# Patient Record
Sex: Male | Born: 1960 | Race: White | Hispanic: No | Marital: Married | Smoking: Current every day smoker
Health system: Southern US, Community
[De-identification: ages and names within clinical notes are randomized; demographics above are authoritative.]

## PROBLEM LIST (undated history)

## (undated) DIAGNOSIS — Z860100 Personal history of colon polyps, unspecified: Secondary | ICD-10-CM

## (undated) HISTORY — PX: COLONOSCOPY: SHX174

---

## 2019-10-22 ENCOUNTER — Ambulatory Visit: Payer: Self-pay | Attending: Family

## 2019-10-22 DIAGNOSIS — Z23 Encounter for immunization: Secondary | ICD-10-CM

## 2019-10-22 NOTE — Progress Notes (Signed)
   Covid-19 Vaccination Clinic  Name:  Shawn Reese    MRN: [id number] DOB: May 20, 1961  10/22/2019  Mr. [name] was observed post Covid-19 immunization for 15 minutes without incident. He was provided with Vaccine Information Sheet and instruction to access the V-Safe system.   Mr. [name] was instructed to call 911 with any severe reactions post vaccine: Marland Kitchen Difficulty breathing  . Swelling of face and throat  . A fast heartbeat  . A bad rash all over body  . Dizziness and weakness   Immunizations Administered    Name Date Dose VIS Date Route   Moderna COVID-19 Vaccine 10/22/2019  9:53 AM 0.5 mL 07/07/2019 Intramuscular   Manufacturer: Moderna   Lot: [id number]   NDC: 83437-357-89

## 2019-11-24 ENCOUNTER — Ambulatory Visit: Payer: Self-pay | Attending: Family

## 2019-11-24 DIAGNOSIS — Z23 Encounter for immunization: Secondary | ICD-10-CM

## 2019-11-24 NOTE — Progress Notes (Signed)
   Covid-19 Vaccination Clinic  Name:  Shawn Reese    MRN: [id number] DOB: [birth year]  11/24/2019  Shawn Reese was observed post Covid-19 immunization for 15 minutes without incident. He was provided with Vaccine Information Sheet and instruction to access the V-Safe system.   Shawn Reese was instructed to call 911 with any severe reactions post vaccine: Marland Kitchen Difficulty breathing  . Swelling of face and throat  . A fast heartbeat  . A bad rash all over body  . Dizziness and weakness   Immunizations Administered    Name Date Dose VIS Date Route   Moderna COVID-19 Vaccine 11/24/2019  9:54 AM 0.5 mL 07/2019 Intramuscular   Manufacturer: Moderna   Lot: [id number]   NDC: 03009-233-00

## 2019-12-18 ENCOUNTER — Encounter (HOSPITAL_BASED_OUTPATIENT_CLINIC_OR_DEPARTMENT_OTHER): Payer: Self-pay | Admitting: *Deleted

## 2019-12-18 ENCOUNTER — Other Ambulatory Visit: Payer: Self-pay

## 2019-12-18 ENCOUNTER — Inpatient Hospital Stay (HOSPITAL_BASED_OUTPATIENT_CLINIC_OR_DEPARTMENT_OTHER)
Admission: EM | Admit: 2019-12-18 | Discharge: 2019-12-24 | DRG: 330 | Disposition: A | Discharge status: Home / Self Care | Attending: Internal Medicine | Admitting: Internal Medicine

## 2019-12-18 ENCOUNTER — Emergency Department (HOSPITAL_BASED_OUTPATIENT_CLINIC_OR_DEPARTMENT_OTHER)

## 2019-12-18 DIAGNOSIS — K529 Noninfective gastroenteritis and colitis, unspecified: Secondary | ICD-10-CM | POA: Diagnosis not present

## 2019-12-18 DIAGNOSIS — Z791 Long term (current) use of non-steroidal anti-inflammatories (NSAID): Secondary | ICD-10-CM

## 2019-12-18 DIAGNOSIS — R197 Diarrhea, unspecified: Secondary | ICD-10-CM | POA: Diagnosis present

## 2019-12-18 DIAGNOSIS — A09 Infectious gastroenteritis and colitis, unspecified: Secondary | ICD-10-CM | POA: Diagnosis present

## 2019-12-18 DIAGNOSIS — Z20822 Contact with and (suspected) exposure to covid-19: Secondary | ICD-10-CM | POA: Diagnosis present

## 2019-12-18 DIAGNOSIS — Z86010 Personal history of colonic polyps: Secondary | ICD-10-CM

## 2019-12-18 DIAGNOSIS — K640 First degree hemorrhoids: Secondary | ICD-10-CM | POA: Diagnosis present

## 2019-12-18 DIAGNOSIS — K633 Ulcer of intestine: Secondary | ICD-10-CM | POA: Diagnosis present

## 2019-12-18 DIAGNOSIS — F17200 Nicotine dependence, unspecified, uncomplicated: Secondary | ICD-10-CM | POA: Diagnosis present

## 2019-12-18 DIAGNOSIS — K5732 Diverticulitis of large intestine without perforation or abscess without bleeding: Secondary | ICD-10-CM | POA: Diagnosis present

## 2019-12-18 DIAGNOSIS — E876 Hypokalemia: Secondary | ICD-10-CM | POA: Diagnosis present

## 2019-12-18 DIAGNOSIS — D72829 Elevated white blood cell count, unspecified: Secondary | ICD-10-CM

## 2019-12-18 DIAGNOSIS — K56609 Unspecified intestinal obstruction, unspecified as to partial versus complete obstruction: Secondary | ICD-10-CM | POA: Diagnosis present

## 2019-12-18 DIAGNOSIS — K5669 Other partial intestinal obstruction: Secondary | ICD-10-CM | POA: Diagnosis present

## 2019-12-18 DIAGNOSIS — E871 Hypo-osmolality and hyponatremia: Secondary | ICD-10-CM | POA: Diagnosis present

## 2019-12-18 DIAGNOSIS — R109 Unspecified abdominal pain: Secondary | ICD-10-CM

## 2019-12-18 LAB — CBC
HCT: 45.7 % (ref 39.0–52.0)
Hemoglobin: 15.8 g/dL (ref 13.0–17.0)
MCH: 30.9 pg (ref 26.0–34.0)
MCHC: 34.6 g/dL (ref 30.0–36.0)
MCV: 89.4 fL (ref 80.0–100.0)
Platelets: 346 10*3/uL (ref 150–400)
RBC: 5.11 MIL/uL (ref 4.22–5.81)
RDW: 13.2 % (ref 11.5–15.5)
WBC: 22.3 10*3/uL — ABNORMAL HIGH (ref 4.0–10.5)
nRBC: 0 % (ref 0.0–0.2)

## 2019-12-18 LAB — DIFFERENTIAL
Abs Immature Granulocytes: 0.57 10*3/uL — ABNORMAL HIGH (ref 0.00–0.07)
Basophils Absolute: 0 10*3/uL (ref 0.0–0.1)
Basophils Relative: 0 %
Eosinophils Absolute: 0 10*3/uL (ref 0.0–0.5)
Eosinophils Relative: 0 %
Immature Granulocytes: 3 %
Lymphocytes Relative: 7 %
Lymphs Abs: 1.6 10*3/uL (ref 0.7–4.0)
Monocytes Absolute: 3.1 10*3/uL — ABNORMAL HIGH (ref 0.1–1.0)
Monocytes Relative: 14 %
Neutro Abs: 17.1 10*3/uL — ABNORMAL HIGH (ref 1.7–7.7)
Neutrophils Relative %: 76 %
Smear Review: NORMAL

## 2019-12-18 LAB — SARS CORONAVIRUS 2 BY RT PCR (HOSPITAL ORDER, PERFORMED IN ~~LOC~~ HOSPITAL LAB): SARS Coronavirus 2: NEGATIVE

## 2019-12-18 LAB — URINALYSIS, ROUTINE W REFLEX MICROSCOPIC
Glucose, UA: NEGATIVE mg/dL
Hgb urine dipstick: NEGATIVE
Ketones, ur: NEGATIVE mg/dL
Leukocytes,Ua: NEGATIVE
Nitrite: NEGATIVE
Protein, ur: 30 mg/dL — AB
Specific Gravity, Urine: 1.025 (ref 1.005–1.030)
pH: 6 (ref 5.0–8.0)

## 2019-12-18 LAB — COMPREHENSIVE METABOLIC PANEL
ALT: 21 U/L (ref 0–44)
AST: 16 U/L (ref 15–41)
Albumin: 3.3 g/dL — ABNORMAL LOW (ref 3.5–5.0)
Alkaline Phosphatase: 86 U/L (ref 38–126)
Anion gap: 12 (ref 5–15)
BUN: 18 mg/dL (ref 6–20)
CO2: 26 mmol/L (ref 22–32)
Calcium: 8.4 mg/dL — ABNORMAL LOW (ref 8.9–10.3)
Chloride: 92 mmol/L — ABNORMAL LOW (ref 98–111)
Creatinine, Ser: 0.7 mg/dL (ref 0.61–1.24)
GFR calc Af Amer: >60 mL/min (ref >60)
GFR calc non Af Amer: >60 mL/min (ref >60)
Glucose, Bld: 128 mg/dL — ABNORMAL HIGH (ref 70–99)
Potassium: 3.3 mmol/L — ABNORMAL LOW (ref 3.5–5.1)
Sodium: 130 mmol/L — ABNORMAL LOW (ref 135–145)
Total Bilirubin: 1 mg/dL (ref 0.3–1.2)
Total Protein: 6.9 g/dL (ref 6.5–8.1)

## 2019-12-18 LAB — URINALYSIS, MICROSCOPIC (REFLEX)

## 2019-12-18 LAB — LACTIC ACID, PLASMA: Lactic Acid, Venous: 0.9 mmol/L (ref 0.5–1.9)

## 2019-12-18 LAB — LIPASE, BLOOD: Lipase: 20 U/L (ref 11–51)

## 2019-12-18 MED ORDER — SODIUM CHLORIDE 0.9 % IV BOLUS (SEPSIS)
1000.0000 mL | Freq: Once | INTRAVENOUS | Status: AC
  Administered 2019-12-18: 1000 mL via INTRAVENOUS

## 2019-12-18 MED ORDER — SODIUM CHLORIDE 0.9% FLUSH
3.0000 mL | Freq: Once | INTRAVENOUS | Status: DC
  Filled 2019-12-18: qty 3

## 2019-12-18 MED ORDER — PIPERACILLIN-TAZOBACTAM 3.375 G IVPB 30 MIN
3.3750 g | Freq: Once | INTRAVENOUS | Status: AC
  Administered 2019-12-18: 3.375 g via INTRAVENOUS
  Filled 2019-12-18 (×2): qty 50

## 2019-12-18 MED ORDER — SODIUM CHLORIDE 0.9 % IV SOLN
2.0000 g | Freq: Once | INTRAVENOUS | Status: AC
  Administered 2019-12-18: 2 g via INTRAVENOUS
  Filled 2019-12-18: qty 20

## 2019-12-18 MED ORDER — IOHEXOL 300 MG/ML  SOLN
100.0000 mL | Freq: Once | INTRAMUSCULAR | Status: AC | PRN
  Administered 2019-12-18: 100 mL via INTRAVENOUS

## 2019-12-18 MED ORDER — SODIUM CHLORIDE 0.9 % IV SOLN
1000.0000 mL | INTRAVENOUS | Status: DC
  Administered 2019-12-18: 1000 mL via INTRAVENOUS

## 2019-12-18 MED ORDER — ONDANSETRON HCL 4 MG/2ML IJ SOLN
4.0000 mg | Freq: Once | INTRAMUSCULAR | Status: AC
  Administered 2019-12-18: 4 mg via INTRAVENOUS
  Filled 2019-12-18: qty 2

## 2019-12-18 MED ORDER — ONDANSETRON HCL 4 MG/2ML IJ SOLN
4.0000 mg | INTRAMUSCULAR | Status: DC | PRN
  Administered 2019-12-18 – 2019-12-23 (×4): 4 mg via INTRAVENOUS
  Filled 2019-12-18 (×4): qty 2

## 2019-12-18 MED ORDER — METRONIDAZOLE IN NACL 5-0.79 MG/ML-% IV SOLN
500.0000 mg | Freq: Once | INTRAVENOUS | Status: AC
  Administered 2019-12-18: 500 mg via INTRAVENOUS
  Filled 2019-12-18: qty 100

## 2019-12-18 MED ORDER — MORPHINE SULFATE (PF) 4 MG/ML IV SOLN
4.0000 mg | INTRAVENOUS | Status: DC | PRN
  Administered 2019-12-18 – 2019-12-19 (×4): 4 mg via INTRAVENOUS
  Filled 2019-12-18 (×4): qty 1

## 2019-12-18 MED ORDER — MORPHINE SULFATE (PF) 4 MG/ML IV SOLN
4.0000 mg | Freq: Once | INTRAVENOUS | Status: AC
  Administered 2019-12-18: 4 mg via INTRAVENOUS
  Filled 2019-12-18: qty 1

## 2019-12-18 NOTE — ED Triage Notes (Signed)
Generalized abdominal pain for a week. Pain started while in [place]. Nausea. No diarrhea.

## 2019-12-18 NOTE — ED Notes (Signed)
[facility] notified (Kim) - patient ready for transport 

## 2019-12-18 NOTE — ED Notes (Signed)
ED Provider at bedside. 

## 2019-12-18 NOTE — ED Notes (Signed)
In CT

## 2019-12-18 NOTE — ED Provider Notes (Signed)
[facility] EMERGENCY DEPARTMENT Provider Note   CSN: [id number] Arrival date & time: 12/18/19  1638     History Chief Complaint  Patient presents with  . Abdominal Pain    Shawn Reese is a 59 y.o. male.  HPI Patient is otherwise healthy.  He traveled to the [place] last week.  He reports he stayed in resorts.  Patient has had his Covid immunizations.  He reports he was doing well and then Saturday, 6 days ago, he started to get sick.  He first developed chills and abdominal cramping and diarrhea.  Symptoms have persisted with worsening abdominal cramping and bloating and distention.  He reports his stool has been brown and liquidy.  He denies there is been blood that he seen.  He has not had vomiting.  He reports that his abdominal pain and distention have increased significantly over the past day.  He reports other people with whom he traveled are well.  No one else was developed diarrheal illness.    History reviewed. No pertinent past medical history.  Patient Active Problem List   Diagnosis Date Noted  . Colon obstruction ([place]) 12/18/2019    History reviewed. No pertinent surgical history.     No family history on file.  Social History   Tobacco Use  . Smoking status: Current Every Day Smoker  . Smokeless tobacco: Never Used  Substance Use Topics  . Alcohol use: Yes  . Drug use: Never    Home Medications Prior to Admission medications   Not on File    Allergies    Patient has no known allergies.  Review of Systems   Review of Systems 10 Systems reviewed and are negative for acute change except as noted in the HPI.  Physical Exam Updated Vital Signs BP 127/90 (BP Location: Left Arm)   Pulse 81   Temp 99.3 F (37.4 C) (Oral)   Resp (!) 21   Ht 5\' 7"  (1.702 m)   Wt 74.8 kg   SpO2 93%   BMI 25.84 kg/m   Physical Exam Constitutional:      Comments: Patient alert nontoxic.  Mental status clear.  No respiratory distress.   Well-nourished well-developed.  HENT:     Head: Normocephalic and atraumatic.     Mouth/Throat:     Mouth: Mucous membranes are moist.     Pharynx: Oropharynx is clear.  Eyes:     Extraocular Movements: Extraocular movements intact.     Conjunctiva/sclera: Conjunctivae normal.  Cardiovascular:     Rate and Rhythm: Normal rate and regular rhythm.     Heart sounds: Normal heart sounds.  Pulmonary:     Effort: Pulmonary effort is normal.     Breath sounds: Normal breath sounds.  Abdominal:     Comments: Abdomen is distended.  Diffusely tender to palpation.  Musculoskeletal:        General: No swelling or tenderness. Normal range of motion.     Cervical back: Neck supple.     Right lower leg: No edema.     Left lower leg: No edema.  Skin:    General: Skin is warm and dry.  Neurological:     General: No focal deficit present.     Mental Status: He is oriented to person, place, and time.     Coordination: Coordination normal.  Psychiatric:        Mood and Affect: Mood normal.     ED Results / Procedures / Treatments   Labs (  all labs ordered are listed, but only abnormal results are displayed) Labs Reviewed  COMPREHENSIVE METABOLIC PANEL - Abnormal; Notable for the following components:      Result Value   Sodium 130 (*)    Potassium 3.3 (*)    Chloride 92 (*)    Glucose, Bld 128 (*)    Calcium 8.4 (*)    Albumin 3.3 (*)    All other components within normal limits  CBC - Abnormal; Notable for the following components:   WBC 22.3 (*)    All other components within normal limits  URINALYSIS, ROUTINE W REFLEX MICROSCOPIC - Abnormal; Notable for the following components:   Bilirubin Urine SMALL (*)    Protein, ur 30 (*)    All other components within normal limits  URINALYSIS, MICROSCOPIC (REFLEX) - Abnormal; Notable for the following components:   Bacteria, UA MANY (*)    All other components within normal limits  DIFFERENTIAL - Abnormal; Notable for the following  components:   Neutro Abs 17.1 (*)    Monocytes Absolute 3.1 (*)    Abs Immature Granulocytes 0.57 (*)    All other components within normal limits  SARS CORONAVIRUS 2 BY RT PCR (HOSPITAL ORDER, PERFORMED IN [facility])  CULTURE, BLOOD (ROUTINE X 2)  CULTURE, BLOOD (ROUTINE X 2)  GASTROINTESTINAL PANEL BY PCR, STOOL (REPLACES STOOL CULTURE)  C DIFFICILE QUICK SCREEN W PCR REFLEX  LIPASE, BLOOD  LACTIC ACID, PLASMA  DIFFERENTIAL  LACTIC ACID, PLASMA    EKG None  Radiology CT Abdomen Pelvis W Contrast  Result Date: 12/18/2019 CLINICAL DATA:  Abdomen distension diarrhea EXAM: CT ABDOMEN AND PELVIS WITH CONTRAST TECHNIQUE: Multidetector CT imaging of the abdomen and pelvis was performed using the standard protocol following bolus administration of intravenous contrast. CONTRAST:  OMNIPAQUE IOHEXOL 300 MG/ML  SOLN COMPARISON:  None. FINDINGS: Lower chest: Lung bases demonstrate subsegmental atelectasis at the right base. No consolidation or pleural effusion. Cardiac size within normal limits. Hepatobiliary: Subcentimeter hypodensity in the left hepatic lobe too small to characterize. No calcified gallstone or biliary dilatation. Pancreas: Unremarkable. No pancreatic ductal dilatation or surrounding inflammatory changes. Spleen: Normal in size without focal abnormality. Adrenals/Urinary Tract: Mild nodular thickening of the adrenal glands without dominant mass. Multiple subcentimeter hypodense renal lesions too small to further characterize. No hydronephrosis. The bladder is normal Stomach/Bowel: The stomach is nonenlarged. Fluid distended nondilated terminal ileum. Air and fluid distension of the: Measuring up to 9.2 cm. Wall thickening of the descending colon with surrounding inflammatory process. Irregular wall thickening at the sigmoid colon serving as the point of transition. Diffuse diverticular disease of the colon. Negative appendix. Linear peripherally oriented gas  collections within the ascending and transverse colon raising concern for intramural air. Negative for portal venous gas. Soft tissue stranding about the right colon as well. Possible small intramural gas and fluid collections within the sigmoid:, for example series 2, image number 77, left-sided 2.5 cm gas and fluid collection. Additional 18 mm gas and fluid collection on the right side, series 2, image number 83. Vascular/Lymphatic: Moderate aortic atherosclerosis. No aneurysm. Visible portions of the SMA and SMV enhance normally. No suspicious adenopathy Reproductive: Prostate is unremarkable. Other: No free air. Small free fluid in the pelvis. Small amount of fluid in the pararenal spaces. Musculoskeletal: No acute or suspicious osseous abnormality. IMPRESSION: 1. Findings consistent with colon obstruction. Point of transition is at the sigmoid colon where there is irregular masslike narrowing of the bowel with bowel  wall thickening. Primary concern is obstructing neoplasm, though inflammatory stricture is also considered given the presence of diverticular disease in the colon. Potential small intramural abscess within the sigmoid colon upstream and downstream to the point of obstruction. Concern raised for intramural air involving the ascending and transverse colon, given appearance of linear peripherally distributed gas collections. No portal venous gas or free air visible at this time. 2. Mild colon wall thickening/colitis of the descending colon with surrounding inflammatory changes. Pericolonic inflammatory changes involving the right colon as well though without substantial colon wall thickening at this location. 3. Free fluid in the pelvis. Critical Value/emergent results were called by telephone at the time of interpretation on 12/18/2019 at 7:53 pm to provider [facility] , who verbally acknowledged these results. Electronically Signed   By: Jasmine Pang M.D.   On: 12/18/2019 19:54     Procedures Procedures (including critical care time)  Medications Ordered in ED Medications  sodium chloride 0.9 % bolus 1,000 mL (0 mLs Intravenous Stopped 12/18/19 2024)    Followed by  0.9 %  sodium chloride infusion (1,000 mLs Intravenous New Bag/Given 12/18/19 2026)  morphine 4 MG/ML injection 4 mg (4 mg Intravenous Given 12/18/19 2108)  ondansetron (ZOFRAN) injection 4 mg (4 mg Intravenous Given 12/18/19 2108)  cefTRIAXone (ROCEPHIN) 2 g in sodium chloride 0.9 % 100 mL IVPB (2 g Intravenous New Bag/Given 12/18/19 2236)  metroNIDAZOLE (FLAGYL) IVPB 500 mg (has no administration in time range)  morphine 4 MG/ML injection 4 mg (4 mg Intravenous Given 12/18/19 1835)  ondansetron (ZOFRAN) injection 4 mg (4 mg Intravenous Given 12/18/19 1835)  iohexol (OMNIPAQUE) 300 MG/ML solution 100 mL (100 mLs Intravenous Contrast Given 12/18/19 1851)  piperacillin-tazobactam (ZOSYN) IVPB 3.375 g (0 g Intravenous Stopped 12/18/19 2110)    ED Course  I have reviewed the triage vital signs and the nursing notes.  Pertinent labs & imaging results that were available during my care of the patient were reviewed by me and considered in my medical decision making (see chart for details).    MDM Rules/Calculators/A&P                     Consult: Reviewed with general surgery Dr. Donell Beers.  Will initiate antibiotics for suspected infectious source.  She will see the patient in consult with planned admission to hospitalist service.  Consult: Reviewed with Dr. Maisie Fus for medical admission.  Consult: Reviewed Dr. Luciana Axe infectious disease.  Agrees suspicion for possible typhoid.  Will add ceftriaxone and Rocephin.  Patient has no significant past medical history.  Last colonoscopy approximately 4 years ago.  He reports he has had some benign polyps removed.  Patient symptoms started as outlined while traveling in the [place].  No other people traveling them have become ill.  Patient has significant  leukocytosis and extensive changes on CT scan including discern for gas in the bowel wall and significant inflammatory changes.  Clinically, patient appears better than reflected by CT scan.  Consideration is given to infectious source namely typhoid.  This was reviewed with Dr. Luciana Axe.  Patient has been given broad-spectrum antibiotics, fluids and pain medications.  Will be admitted to medical service with consults placed to general surgery and infectious disease. Final Clinical Impression(s) / ED Diagnoses Final diagnoses:  Colitis  Leukocytosis, unspecified type  Diarrhea of presumed infectious origin    Rx / DC Orders ED Discharge Orders    None       Arby Barrette, MD 12/18/19  2251  

## 2019-12-19 ENCOUNTER — Inpatient Hospital Stay (HOSPITAL_COMMUNITY): Admitting: Certified Registered Nurse Anesthetist

## 2019-12-19 ENCOUNTER — Encounter (HOSPITAL_COMMUNITY)
Admission: EM | Disposition: A | Discharge status: Home / Self Care | Payer: Self-pay | Source: Home / Self Care | Attending: Internal Medicine

## 2019-12-19 ENCOUNTER — Inpatient Hospital Stay (HOSPITAL_COMMUNITY)

## 2019-12-19 DIAGNOSIS — K529 Noninfective gastroenteritis and colitis, unspecified: Secondary | ICD-10-CM

## 2019-12-19 DIAGNOSIS — K56609 Unspecified intestinal obstruction, unspecified as to partial versus complete obstruction: Secondary | ICD-10-CM

## 2019-12-19 DIAGNOSIS — E876 Hypokalemia: Secondary | ICD-10-CM

## 2019-12-19 DIAGNOSIS — A09 Infectious gastroenteritis and colitis, unspecified: Secondary | ICD-10-CM

## 2019-12-19 DIAGNOSIS — E871 Hypo-osmolality and hyponatremia: Secondary | ICD-10-CM

## 2019-12-19 HISTORY — PX: LAPAROTOMY: SHX154

## 2019-12-19 HISTORY — PX: SUBMUCOSAL TATTOO INJECTION: SHX6856

## 2019-12-19 HISTORY — PX: FLEXIBLE SIGMOIDOSCOPY: SHX5431

## 2019-12-19 LAB — TSH: TSH: 1.193 u[IU]/mL (ref 0.350–4.500)

## 2019-12-19 LAB — COMPREHENSIVE METABOLIC PANEL
ALT: 17 U/L (ref 0–44)
AST: 10 U/L — ABNORMAL LOW (ref 15–41)
Albumin: 2.6 g/dL — ABNORMAL LOW (ref 3.5–5.0)
Alkaline Phosphatase: 70 U/L (ref 38–126)
Anion gap: 8 (ref 5–15)
BUN: 15 mg/dL (ref 6–20)
CO2: 26 mmol/L (ref 22–32)
Calcium: 7.8 mg/dL — ABNORMAL LOW (ref 8.9–10.3)
Chloride: 101 mmol/L (ref 98–111)
Creatinine, Ser: 0.72 mg/dL (ref 0.61–1.24)
GFR calc Af Amer: >60 mL/min (ref >60)
GFR calc non Af Amer: >60 mL/min (ref >60)
Glucose, Bld: 112 mg/dL — ABNORMAL HIGH (ref 70–99)
Potassium: 3.4 mmol/L — ABNORMAL LOW (ref 3.5–5.1)
Sodium: 135 mmol/L (ref 135–145)
Total Bilirubin: 0.8 mg/dL (ref 0.3–1.2)
Total Protein: 5.5 g/dL — ABNORMAL LOW (ref 6.5–8.1)

## 2019-12-19 LAB — HIV ANTIBODY (ROUTINE TESTING W REFLEX): HIV Screen 4th Generation wRfx: NONREACTIVE

## 2019-12-19 LAB — CBC WITH DIFFERENTIAL/PLATELET
Abs Immature Granulocytes: 0 10*3/uL (ref 0.00–0.07)
Band Neutrophils: 7 %
Basophils Absolute: 0 10*3/uL (ref 0.0–0.1)
Basophils Relative: 0 %
Eosinophils Absolute: 0 10*3/uL (ref 0.0–0.5)
Eosinophils Relative: 0 %
HCT: 41.6 % (ref 39.0–52.0)
Hemoglobin: 13.7 g/dL (ref 13.0–17.0)
Lymphocytes Relative: 6 %
Lymphs Abs: 1.2 10*3/uL (ref 0.7–4.0)
MCH: 30.3 pg (ref 26.0–34.0)
MCHC: 32.9 g/dL (ref 30.0–36.0)
MCV: 92 fL (ref 80.0–100.0)
Monocytes Absolute: 2 10*3/uL — ABNORMAL HIGH (ref 0.1–1.0)
Monocytes Relative: 10 %
Neutro Abs: 16.4 10*3/uL — ABNORMAL HIGH (ref 1.7–7.7)
Neutrophils Relative %: 77 %
Platelets: 289 10*3/uL (ref 150–400)
RBC: 4.52 MIL/uL (ref 4.22–5.81)
RDW: 13.2 % (ref 11.5–15.5)
WBC: 19.5 10*3/uL — ABNORMAL HIGH (ref 4.0–10.5)
nRBC: 0 % (ref 0.0–0.2)

## 2019-12-19 LAB — GLUCOSE, CAPILLARY: Glucose-Capillary: 101 mg/dL — ABNORMAL HIGH (ref 70–99)

## 2019-12-19 LAB — LACTIC ACID, PLASMA
Lactic Acid, Venous: 1 mmol/L (ref 0.5–1.9)
Lactic Acid, Venous: 1 mmol/L (ref 0.5–1.9)

## 2019-12-19 LAB — PROTIME-INR
INR: 1.1 (ref 0.8–1.2)
Prothrombin Time: 14 seconds (ref 11.4–15.2)

## 2019-12-19 SURGERY — SIGMOIDOSCOPY, FLEXIBLE
Anesthesia: General

## 2019-12-19 SURGERY — LAPAROTOMY, EXPLORATORY
Anesthesia: General | Site: Abdomen

## 2019-12-19 MED ORDER — MIDAZOLAM HCL 5 MG/5ML IJ SOLN
INTRAMUSCULAR | Status: DC | PRN
  Administered 2019-12-19: 2 mg via INTRAVENOUS

## 2019-12-19 MED ORDER — PHENYLEPHRINE 40 MCG/ML (10ML) SYRINGE FOR IV PUSH (FOR BLOOD PRESSURE SUPPORT)
PREFILLED_SYRINGE | INTRAVENOUS | Status: DC | PRN
  Administered 2019-12-19: 40 ug via INTRAVENOUS

## 2019-12-19 MED ORDER — SUCCINYLCHOLINE CHLORIDE 200 MG/10ML IV SOSY
PREFILLED_SYRINGE | INTRAVENOUS | Status: AC
  Filled 2019-12-19: qty 10

## 2019-12-19 MED ORDER — DEXAMETHASONE SODIUM PHOSPHATE 10 MG/ML IJ SOLN
INTRAMUSCULAR | Status: DC | PRN
  Administered 2019-12-19: 10 mg via INTRAVENOUS

## 2019-12-19 MED ORDER — LACTATED RINGERS IV SOLN: INTRAVENOUS | Status: DC

## 2019-12-19 MED ORDER — HYDROMORPHONE HCL 1 MG/ML IJ SOLN
0.2500 mg | INTRAMUSCULAR | Status: DC | PRN
  Administered 2019-12-19 (×2): 0.5 mg via INTRAVENOUS

## 2019-12-19 MED ORDER — SPOT INK MARKER SYRINGE KIT
PACK | SUBMUCOSAL | Status: DC | PRN
  Administered 2019-12-19: 2 mL via SUBMUCOSAL

## 2019-12-19 MED ORDER — ONDANSETRON HCL 4 MG/2ML IJ SOLN
INTRAMUSCULAR | Status: DC | PRN
  Administered 2019-12-19: 4 mg via INTRAVENOUS

## 2019-12-19 MED ORDER — ROCURONIUM BROMIDE 10 MG/ML (PF) SYRINGE
PREFILLED_SYRINGE | INTRAVENOUS | Status: AC
  Filled 2019-12-19: qty 10

## 2019-12-19 MED ORDER — ACETAMINOPHEN 10 MG/ML IV SOLN
INTRAVENOUS | Status: AC
  Filled 2019-12-19: qty 100

## 2019-12-19 MED ORDER — HYDROMORPHONE HCL 1 MG/ML IJ SOLN
INTRAMUSCULAR | Status: AC
  Filled 2019-12-19: qty 1

## 2019-12-19 MED ORDER — SODIUM CHLORIDE 0.9 % IV SOLN
INTRAVENOUS | Status: AC
  Filled 2019-12-19: qty 20

## 2019-12-19 MED ORDER — LIDOCAINE 2% (20 MG/ML) 5 ML SYRINGE
INTRAMUSCULAR | Status: DC | PRN
  Administered 2019-12-19: 70 mg via INTRAVENOUS

## 2019-12-19 MED ORDER — ONDANSETRON HCL 4 MG/2ML IJ SOLN: 4.0000 mg | Freq: Four times a day (QID) | INTRAMUSCULAR | Status: DC | PRN

## 2019-12-19 MED ORDER — FENTANYL CITRATE (PF) 100 MCG/2ML IJ SOLN
INTRAMUSCULAR | Status: DC | PRN
  Administered 2019-12-19: 100 ug via INTRAVENOUS

## 2019-12-19 MED ORDER — PROPOFOL 10 MG/ML IV BOLUS
INTRAVENOUS | Status: DC | PRN
  Administered 2019-12-19: 140 mg via INTRAVENOUS

## 2019-12-19 MED ORDER — LIDOCAINE 2% (20 MG/ML) 5 ML SYRINGE
INTRAMUSCULAR | Status: DC | PRN
  Administered 2019-12-19: 60 mg via INTRAVENOUS

## 2019-12-19 MED ORDER — PROMETHAZINE HCL 25 MG/ML IJ SOLN: 6.2500 mg | INTRAMUSCULAR | Status: DC | PRN

## 2019-12-19 MED ORDER — PROPOFOL 10 MG/ML IV BOLUS
INTRAVENOUS | Status: DC | PRN
  Administered 2019-12-19: 150 mg via INTRAVENOUS

## 2019-12-19 MED ORDER — METRONIDAZOLE IN NACL 5-0.79 MG/ML-% IV SOLN
500.0000 mg | Freq: Three times a day (TID) | INTRAVENOUS | Status: DC
  Administered 2019-12-19 – 2019-12-24 (×16): 500 mg via INTRAVENOUS
  Filled 2019-12-19 (×16): qty 100

## 2019-12-19 MED ORDER — SUCCINYLCHOLINE CHLORIDE 200 MG/10ML IV SOSY
PREFILLED_SYRINGE | INTRAVENOUS | Status: DC | PRN
  Administered 2019-12-19: 80 mg via INTRAVENOUS

## 2019-12-19 MED ORDER — ONDANSETRON HCL 4 MG/2ML IJ SOLN
INTRAMUSCULAR | Status: AC
  Filled 2019-12-19: qty 2

## 2019-12-19 MED ORDER — OXYCODONE HCL 5 MG PO TABS: 5.0000 mg | ORAL_TABLET | Freq: Once | ORAL | Status: DC | PRN

## 2019-12-19 MED ORDER — IPRATROPIUM-ALBUTEROL 0.5-2.5 (3) MG/3ML IN SOLN
3.0000 mL | Freq: Once | RESPIRATORY_TRACT | Status: AC
  Administered 2019-12-19: 3 mL via RESPIRATORY_TRACT
  Filled 2019-12-19: qty 3

## 2019-12-19 MED ORDER — FENTANYL CITRATE (PF) 100 MCG/2ML IJ SOLN
INTRAMUSCULAR | Status: AC
  Filled 2019-12-19: qty 2

## 2019-12-19 MED ORDER — SUCCINYLCHOLINE CHLORIDE 200 MG/10ML IV SOSY
PREFILLED_SYRINGE | INTRAVENOUS | Status: DC | PRN
  Administered 2019-12-19: 120 mg via INTRAVENOUS

## 2019-12-19 MED ORDER — ENOXAPARIN SODIUM 40 MG/0.4ML ~~LOC~~ SOLN
40.0000 mg | SUBCUTANEOUS | Status: DC
  Administered 2019-12-19 – 2019-12-23 (×5): 40 mg via SUBCUTANEOUS
  Filled 2019-12-19 (×5): qty 0.4

## 2019-12-19 MED ORDER — MORPHINE SULFATE (PF) 4 MG/ML IV SOLN
1.0000 mg | INTRAVENOUS | Status: DC | PRN
  Administered 2019-12-19: 2 mg via INTRAVENOUS

## 2019-12-19 MED ORDER — OXYCODONE HCL 5 MG/5ML PO SOLN: 5.0000 mg | Freq: Once | ORAL | Status: DC | PRN

## 2019-12-19 MED ORDER — ALBUTEROL SULFATE (2.5 MG/3ML) 0.083% IN NEBU: 2.5000 mg | INHALATION_SOLUTION | RESPIRATORY_TRACT | Status: DC | PRN

## 2019-12-19 MED ORDER — ROCURONIUM BROMIDE 10 MG/ML (PF) SYRINGE
PREFILLED_SYRINGE | INTRAVENOUS | Status: DC | PRN
  Administered 2019-12-19: 20 mg via INTRAVENOUS
  Administered 2019-12-19: 50 mg via INTRAVENOUS
  Administered 2019-12-19 (×2): 10 mg via INTRAVENOUS

## 2019-12-19 MED ORDER — LIDOCAINE 2% (20 MG/ML) 5 ML SYRINGE
INTRAMUSCULAR | Status: AC
  Filled 2019-12-19: qty 5

## 2019-12-19 MED ORDER — KCL IN DEXTROSE-NACL 20-5-0.45 MEQ/L-%-% IV SOLN
INTRAVENOUS | Status: DC
  Filled 2019-12-19 (×5): qty 1000

## 2019-12-19 MED ORDER — ALBUMIN HUMAN 5 % IV SOLN
INTRAVENOUS | Status: DC | PRN
Start: 2019-12-19 — End: 2019-12-19

## 2019-12-19 MED ORDER — SODIUM CHLORIDE 0.9 % IR SOLN
Status: DC | PRN
  Administered 2019-12-19: 5000 mL

## 2019-12-19 MED ORDER — MORPHINE SULFATE (PF) 2 MG/ML IV SOLN
2.0000 mg | INTRAVENOUS | Status: DC | PRN
  Administered 2019-12-20 – 2019-12-21 (×5): 2 mg via INTRAVENOUS
  Filled 2019-12-19 (×5): qty 1

## 2019-12-19 MED ORDER — FENTANYL CITRATE (PF) 250 MCG/5ML IJ SOLN
INTRAMUSCULAR | Status: AC
  Filled 2019-12-19: qty 5

## 2019-12-19 MED ORDER — KCL IN DEXTROSE-NACL 20-5-0.45 MEQ/L-%-% IV SOLN
INTRAVENOUS | Status: AC
  Filled 2019-12-19: qty 1000

## 2019-12-19 MED ORDER — MIDAZOLAM HCL 2 MG/2ML IJ SOLN
INTRAMUSCULAR | Status: AC
  Filled 2019-12-19: qty 2

## 2019-12-19 MED ORDER — ACETAMINOPHEN 10 MG/ML IV SOLN
1000.0000 mg | Freq: Once | INTRAVENOUS | Status: DC | PRN
  Administered 2019-12-19: 1000 mg via INTRAVENOUS

## 2019-12-19 MED ORDER — KETOROLAC TROMETHAMINE 30 MG/ML IJ SOLN
30.0000 mg | Freq: Once | INTRAMUSCULAR | Status: AC
  Administered 2019-12-19: 30 mg via INTRAVENOUS

## 2019-12-19 MED ORDER — PROPOFOL 10 MG/ML IV BOLUS
INTRAVENOUS | Status: AC
  Filled 2019-12-19: qty 20

## 2019-12-19 MED ORDER — SODIUM CHLORIDE 0.9 % IV SOLN
2.0000 g | INTRAVENOUS | Status: DC
  Administered 2019-12-20 – 2019-12-23 (×4): 2 g via INTRAVENOUS
  Filled 2019-12-19: qty 20
  Filled 2019-12-19 (×3): qty 2

## 2019-12-19 MED ORDER — KETOROLAC TROMETHAMINE 30 MG/ML IJ SOLN
INTRAMUSCULAR | Status: AC
  Filled 2019-12-19: qty 1

## 2019-12-19 MED ORDER — MORPHINE SULFATE (PF) 4 MG/ML IV SOLN
INTRAVENOUS | Status: AC
  Filled 2019-12-19: qty 1

## 2019-12-19 MED ORDER — ALBUTEROL SULFATE (2.5 MG/3ML) 0.083% IN NEBU
INHALATION_SOLUTION | RESPIRATORY_TRACT | Status: AC
  Filled 2019-12-19: qty 3

## 2019-12-19 MED ORDER — FENTANYL CITRATE (PF) 100 MCG/2ML IJ SOLN
INTRAMUSCULAR | Status: DC | PRN
  Administered 2019-12-19 (×5): 50 ug via INTRAVENOUS

## 2019-12-19 MED ORDER — ONDANSETRON HCL 4 MG PO TABS: 4.0000 mg | ORAL_TABLET | Freq: Four times a day (QID) | ORAL | Status: DC | PRN

## 2019-12-19 SURGICAL SUPPLY — 50 items
APPLICATOR COTTON TIP 6 STRL (MISCELLANEOUS) ×1 IMPLANT
APPLICATOR COTTON TIP 6IN STRL (MISCELLANEOUS) ×3
BLADE EXTENDED COATED 6.5IN (ELECTRODE) IMPLANT
BLADE HEX COATED 2.75 (ELECTRODE) ×3 IMPLANT
COVER MAYO STAND STRL (DRAPES) ×3 IMPLANT
COVER SURGICAL LIGHT HANDLE (MISCELLANEOUS) ×3 IMPLANT
COVER WAND RF STERILE (DRAPES) IMPLANT
DRAPE LAPAROSCOPIC ABDOMINAL (DRAPES) ×3 IMPLANT
DRAPE WARM FLUID 44X44 (DRAPES) ×3 IMPLANT
DRSG PAD ABDOMINAL 8X10 ST (GAUZE/BANDAGES/DRESSINGS) ×3 IMPLANT
ELECT PENCIL ROCKER SW 15FT (MISCELLANEOUS) ×3 IMPLANT
ELECT REM PT RETURN 15FT ADLT (MISCELLANEOUS) ×3 IMPLANT
GAUZE SPONGE 4X4 12PLY STRL (GAUZE/BANDAGES/DRESSINGS) ×3 IMPLANT
GLOVE BIOGEL PI IND STRL 7.0 (GLOVE) ×1 IMPLANT
GLOVE BIOGEL PI INDICATOR 7.0 (GLOVE) ×2
GLOVE SURG SYN 7.5  E (GLOVE) ×4
GLOVE SURG SYN 7.5 E (GLOVE) ×2 IMPLANT
GOWN SPEC L4 XLG W/TWL (GOWN DISPOSABLE) ×3 IMPLANT
GOWN STRL REUS W/ TWL XL LVL3 (GOWN DISPOSABLE) ×3 IMPLANT
GOWN STRL REUS W/TWL LRG LVL3 (GOWN DISPOSABLE) ×3 IMPLANT
GOWN STRL REUS W/TWL XL LVL3 (GOWN DISPOSABLE) ×6
HANDLE SUCTION POOLE (INSTRUMENTS) ×1 IMPLANT
KIT BASIN (CUSTOM PROCEDURE TRAY) ×3 IMPLANT
KIT TURNOVER KIT A (KITS) IMPLANT
LIGASURE IMPACT 36 18CM CVD LR (INSTRUMENTS) ×3 IMPLANT
NS IRRIG 1000ML POUR BTL (IV SOLUTION) ×3 IMPLANT
PACK GENERAL/GYN (CUSTOM PROCEDURE TRAY) ×3 IMPLANT
PENCIL SMOKE EVACUATOR (MISCELLANEOUS) IMPLANT
RELOAD STAPLER 60MM BLK (STAPLE) ×2 IMPLANT
SPONGE LAP 18X18 RF (DISPOSABLE) IMPLANT
STAPLE ECHEON FLEX 60 POW ENDO (STAPLE) ×3 IMPLANT
STAPLER CUT CVD 40MM GREEN (STAPLE) ×3 IMPLANT
STAPLER RELOAD 60MM BLK (STAPLE) ×6
STAPLER VISISTAT 35W (STAPLE) ×3 IMPLANT
SUCTION POOLE HANDLE (INSTRUMENTS) ×3
SUT PDS AB 1 CTX 36 (SUTURE) ×6 IMPLANT
SUT PROLENE 2 0 SH DA (SUTURE) ×3 IMPLANT
SUT SILK 2 0 (SUTURE) ×2
SUT SILK 2 0 SH CR/8 (SUTURE) ×3 IMPLANT
SUT SILK 2-0 18XBRD TIE 12 (SUTURE) ×1 IMPLANT
SUT SILK 3 0 (SUTURE) ×2
SUT SILK 3 0 SH CR/8 (SUTURE) ×3 IMPLANT
SUT SILK 3-0 18XBRD TIE 12 (SUTURE) ×1 IMPLANT
SUT VICRYL 2 0 18  UND BR (SUTURE) ×2
SUT VICRYL 2 0 18 UND BR (SUTURE) ×1 IMPLANT
TAPE CLOTH 4X10 WHT NS (GAUZE/BANDAGES/DRESSINGS) ×3 IMPLANT
TOWEL OR 17X26 10 PK STRL BLUE (TOWEL DISPOSABLE) ×6 IMPLANT
TRAY FOLEY MTR SLVR 16FR STAT (SET/KITS/TRAYS/PACK) ×3 IMPLANT
WATER STERILE IRR 1000ML POUR (IV SOLUTION) ×3 IMPLANT
YANKAUER SUCT BULB TIP NO VENT (SUCTIONS) IMPLANT

## 2019-12-19 NOTE — Consult Note (Signed)
Reason for Consult:Large bowel obstruction Referring Physician:  Donnald Garre, MD  Shawn Reese is an 59 y.o. male.  HPI:  Pt is a 59 yo M who went to the [place] and came back around 1 week ago.  He started to have chills, diarrhea, and abdominal cramping around 6 days ago.  He denies nausea/vomiting.  His pain complaint is worsening bloating and pain.  He is vaccinated against COVID 19.  None of his traveling party has become sick.  He has had brown diarrhea, and has not had bloody BMs or mucous.  He stayed in resorts and did not eat at food trucks.    Of note, he has had a colonoscopy around 4 years ago.  He has had polyps and has had them removed.    History reviewed. No pertinent past medical history.  History reviewed. No pertinent surgical history.  No family history on file.  Social History:  reports that he has been smoking. He has never used smokeless tobacco. He reports current alcohol use. He reports that he does not use drugs.  Allergies: No Known Allergies  Medications: none  Results for orders placed or performed during the hospital encounter of 12/18/19 (from the past 48 hour(s))  Lipase, blood     Status: None   Collection Time: 12/18/19  4:54 PM  Result Value Ref Range   Lipase 20 11 - 51 U/L    Comment: Performed at [facility], [address] [place], [place] [address]  Comprehensive metabolic panel     Status: Abnormal   Collection Time: 12/18/19  4:54 PM  Result Value Ref Range   Sodium 130 (L) 135 - 145 mmol/L   Potassium 3.3 (L) 3.5 - 5.1 mmol/L   Chloride 92 (L) 98 - 111 mmol/L   CO2 26 22 - 32 mmol/L   Glucose, Bld 128 (H) 70 - 99 mg/dL    Comment: Glucose reference range applies only to samples taken after fasting for at least 8 hours.   BUN 18 6 - 20 mg/dL   Creatinine, Ser 5.80 0.61 - 1.24 mg/dL   Calcium 8.4 (L) 8.9 - 10.3 mg/dL   Total Protein 6.9 6.5 - 8.1 g/dL   Albumin 3.3 (L) 3.5 - 5.0 g/dL   AST 16 15 - 41 U/L    ALT 21 0 - 44 U/L   Alkaline Phosphatase 86 38 - 126 U/L   Total Bilirubin 1.0 0.3 - 1.2 mg/dL   GFR calc non Af Amer >60 >60 mL/min   GFR calc Af Amer >60 >60 mL/min   Anion gap 12 5 - 15    Comment: Performed at [facility], [address] [place], [place] [address]  CBC     Status: Abnormal   Collection Time: 12/18/19  4:54 PM  Result Value Ref Range   WBC 22.3 (H) 4.0 - 10.5 K/uL   RBC 5.11 4.22 - 5.81 MIL/uL   Hemoglobin 15.8 13.0 - 17.0 g/dL   HCT 82.5 05.3 - 97.6 %   MCV 89.4 80.0 - 100.0 fL   MCH 30.9 26.0 - 34.0 pg   MCHC 34.6 30.0 - 36.0 g/dL   RDW 73.4 19.3 - 79.0 %   Platelets 346 150 - 400 K/uL   nRBC 0.0 0.0 - 0.2 %    Comment: Performed at [facility], [address] [place], [place] [address]  Urinalysis, Routine w reflex microscopic  Status: Abnormal   Collection Time: 12/18/19  4:54 PM  Result Value Ref Range   Color, Urine YELLOW YELLOW   APPearance CLEAR CLEAR   Specific Gravity, Urine 1.025 1.005 - 1.030   pH 6.0 5.0 - 8.0   Glucose, UA NEGATIVE NEGATIVE mg/dL   Hgb urine dipstick NEGATIVE NEGATIVE   Bilirubin Urine SMALL (A) NEGATIVE   Ketones, ur NEGATIVE NEGATIVE mg/dL   Protein, ur 30 (A) NEGATIVE mg/dL   Nitrite NEGATIVE NEGATIVE   Leukocytes,Ua NEGATIVE NEGATIVE    Comment: Performed at [facility], [address] [place], [place] [address]  Urinalysis, Microscopic (reflex)     Status: Abnormal   Collection Time: 12/18/19  4:54 PM  Result Value Ref Range   RBC / HPF 6-10 0 - 5 RBC/hpf   WBC, UA 0-5 0 - 5 WBC/hpf   Bacteria, UA MANY (A) NONE SEEN   Squamous Epithelial / LPF 0-5 0 - 5   Mucus PRESENT    Hyaline Casts, UA PRESENT    Granular Casts, UA PRESENT     Comment: Performed at [facility], [address] [place], [place] [address]  Differential     Status: Abnormal   Collection Time: 12/18/19  4:54 PM  Result Value Ref Range   Neutrophils Relative % 76 %   Neutro Abs 17.1 (H) 1.7  - 7.7 K/uL   Lymphocytes Relative 7 %   Lymphs Abs 1.6 0.7 - 4.0 K/uL   Monocytes Relative 14 %   Monocytes Absolute 3.1 (H) 0.1 - 1.0 K/uL   Eosinophils Relative 0 %   Eosinophils Absolute 0.0 0.0 - 0.5 K/uL   Basophils Relative 0 %   Basophils Absolute 0.0 0.0 - 0.1 K/uL   WBC Morphology MORPHOLOGY UNREMARKABLE    RBC Morphology MORPHOLOGY UNREMARKABLE    Smear Review Normal platelet morphology    Immature Granulocytes 3 %   Abs Immature Granulocytes 0.57 (H) 0.00 - 0.07 K/uL    Comment: Performed at [facility], [address] [place], [place] [address]  Lactic acid, plasma     Status: None   Collection Time: 12/18/19  6:23 PM  Result Value Ref Range   Lactic Acid, Venous 0.9 0.5 - 1.9 mmol/L    Comment: Performed at [facility], [address] [place], [place] [address]  SARS Coronavirus 2 by RT PCR (hospital order, performed in [facility] hospital lab) Nasopharyngeal Nasopharyngeal Swab     Status: None   Collection Time: 12/18/19  8:27 PM   Specimen: Nasopharyngeal Swab  Result Value Ref Range   SARS Coronavirus 2 NEGATIVE NEGATIVE    Comment: (NOTE) SARS-CoV-2 target nucleic acids are NOT DETECTED. The SARS-CoV-2 RNA is generally detectable in upper and lower respiratory specimens during the acute phase of infection. The lowest concentration of SARS-CoV-2 viral copies this assay can detect is 250 copies / mL. A negative result does not preclude SARS-CoV-2 infection and should not be used as the sole basis for treatment or other patient management decisions.  A negative result may occur with improper specimen collection / handling, submission of specimen other than nasopharyngeal swab, presence of viral mutation(s) within the areas targeted by this assay, and inadequate number of viral copies (<250 copies / mL). A negative result must be combined with clinical observations, patient history, and epidemiological information. Fact Sheet for  Patients:   BoilerBrush.com.cy Fact Sheet for Healthcare Providers: https://pope.com/ This test  is not yet approved or cleared  by the [place] and has been authorized for detection and/or diagnosis of SARS-CoV-2 by FDA under an Emergency Use Authorization (EUA).  This EUA will remain in effect (meaning this test can be used) for the duration of the COVID-19 declaration under Section 564(b)(1) of the Act, 21 U.S.C. section 360bbb-3(b)(1), unless the authorization is terminated or revoked sooner. Performed at [facility], [address] [place], [place] [address]     CT Abdomen Pelvis W Contrast  Result Date: 12/18/2019 CLINICAL DATA:  Abdomen distension diarrhea EXAM: CT ABDOMEN AND PELVIS WITH CONTRAST TECHNIQUE: Multidetector CT imaging of the abdomen and pelvis was performed using the standard protocol following bolus administration of intravenous contrast. CONTRAST:  OMNIPAQUE IOHEXOL 300 MG/ML  SOLN COMPARISON:  None. FINDINGS: Lower chest: Lung bases demonstrate subsegmental atelectasis at the right base. No consolidation or pleural effusion. Cardiac size within normal limits. Hepatobiliary: Subcentimeter hypodensity in the left hepatic lobe too small to characterize. No calcified gallstone or biliary dilatation. Pancreas: Unremarkable. No pancreatic ductal dilatation or surrounding inflammatory changes. Spleen: Normal in size without focal abnormality. Adrenals/Urinary Tract: Mild nodular thickening of the adrenal glands without dominant mass. Multiple subcentimeter hypodense renal lesions too small to further characterize. No hydronephrosis. The bladder is normal Stomach/Bowel: The stomach is nonenlarged. Fluid distended nondilated terminal ileum. Air and fluid distension of the: Measuring up to 9.2 cm. Wall thickening of the descending colon with surrounding inflammatory process. Irregular wall thickening at the  sigmoid colon serving as the point of transition. Diffuse diverticular disease of the colon. Negative appendix. Linear peripherally oriented gas collections within the ascending and transverse colon raising concern for intramural air. Negative for portal venous gas. Soft tissue stranding about the right colon as well. Possible small intramural gas and fluid collections within the sigmoid:, for example series 2, image number 77, left-sided 2.5 cm gas and fluid collection. Additional 18 mm gas and fluid collection on the right side, series 2, image number 83. Vascular/Lymphatic: Moderate aortic atherosclerosis. No aneurysm. Visible portions of the SMA and SMV enhance normally. No suspicious adenopathy Reproductive: Prostate is unremarkable. Other: No free air. Small free fluid in the pelvis. Small amount of fluid in the pararenal spaces. Musculoskeletal: No acute or suspicious osseous abnormality. IMPRESSION: 1. Findings consistent with colon obstruction. Point of transition is at the sigmoid colon where there is irregular masslike narrowing of the bowel with bowel wall thickening. Primary concern is obstructing neoplasm, though inflammatory stricture is also considered given the presence of diverticular disease in the colon. Potential small intramural abscess within the sigmoid colon upstream and downstream to the point of obstruction. Concern raised for intramural air involving the ascending and transverse colon, given appearance of linear peripherally distributed gas collections. No portal venous gas or free air visible at this time. 2. Mild colon wall thickening/colitis of the descending colon with surrounding inflammatory changes. Pericolonic inflammatory changes involving the right colon as well though without substantial colon wall thickening at this location. 3. Free fluid in the pelvis. Critical Value/emergent results were called by telephone at the time of interpretation on 12/18/2019 at 7:53 pm to provider  [facility] , who verbally acknowledged these results. Electronically Signed   By: Jasmine Pang M.D.   On: 12/18/2019 19:54    Review of Systems  Constitutional: Positive for chills and fever.  HENT: Negative.   Eyes: Negative.   Respiratory: Negative.   Gastrointestinal: Positive for abdominal distention,  abdominal pain, diarrhea and vomiting.  Endocrine: Negative.   Genitourinary: Negative.   Musculoskeletal: Negative.   Skin: Negative.   Allergic/Immunologic: Negative.   Neurological: Negative.   Hematological: Negative.   Psychiatric/Behavioral: Negative.       Blood pressure 136/85, pulse 80, temperature 98.3 F (36.8 C), temperature source Oral, resp. rate 20, height 5\' 7"  (1.702 m), weight 74.8 kg, SpO2 96 %.   Physical Exam  Constitutional: He is oriented to person, place, and time. He appears well-developed and well-nourished. He appears distressed (mildly).  HENT:  Head: Normocephalic and atraumatic.  Mouth/Throat: Oropharynx is clear and moist.  Eyes: Pupils are equal, round, and reactive to light. Conjunctivae are normal. Right eye exhibits no discharge. Left eye exhibits no discharge. No scleral icterus.  Neck: No tracheal deviation present.  Cardiovascular: Normal rate, regular rhythm and intact distal pulses. Exam reveals no gallop and no friction rub.  No murmur heard. Respiratory: Effort normal and breath sounds normal. No respiratory distress. He exhibits no tenderness.  GI: Soft. He exhibits distension. He exhibits no mass. There is no abdominal tenderness. There is no rebound and no guarding.  Musculoskeletal:        General: No tenderness, deformity or edema.     Cervical back: Normal range of motion and neck supple.  Lymphadenopathy:    He has no cervical adenopathy.  Neurological: He is alert and oriented to person, place, and time. Coordination normal.  Skin: Skin is warm and dry. No rash noted. He is not diaphoretic. No erythema. No pallor.   Psychiatric: He has a normal mood and affect. His behavior is normal. Judgment and thought content normal.     Assessment/Plan: Large bowel obstruction from unclear cause, possibly inflammatory or infectious.  Patient has h/o benign polyp in this location from Dr. Dolphus Jenny in [facility]. ? Diverticulitis in region of recurrent polyp?    Hypokalemia Hyponatremia Diarrhea Recent travel.    Bowel rest IV antibiotics. May need flex sig to evaluate narrowed area. Will likely need surgery. Will reassess to determine timing of surgery.   Stark Klein 12/19/2019, 12:41 AM

## 2019-12-19 NOTE — Anesthesia Procedure Notes (Signed)
Procedure Name: Intubation Date/Time: 12/19/2019 2:28 PM Performed by: [name], CRNA Pre-anesthesia Checklist: Patient identified, Emergency Drugs available, Suction available, Patient being monitored and Timeout performed Patient Re-evaluated:Patient Re-evaluated prior to induction Oxygen Delivery Method: Circle system utilized Preoxygenation: Pre-oxygenation with 100% oxygen Induction Type: IV induction and Rapid sequence Laryngoscope Size: Mac and 3 Grade View: Grade I Tube type: Oral Tube size: 7.5 mm Number of attempts: 1 Airway Equipment and Method: Stylet Placement Confirmation: ETT inserted through vocal cords under direct vision,  positive ETCO2 and breath sounds checked- equal and bilateral Secured at: 23 cm Tube secured with: Tape Dental Injury: Teeth and Oropharynx as per pre-operative assessment

## 2019-12-19 NOTE — Progress Notes (Addendum)
59 yo male who came back from vacation in [place] about one week ago.   Admitted yesterday with colonic obstruction.  CT scan suggests sigmoid colon as point of obstruction.  There is also the question of intramural air involving the ascending and transverse colon.  Sigmoidoscopy today by Dr. Bosie Clos suggest ischemic bowel above the obstruction.  He was no able to go far above the obstruction.  WBC - 19,500 - 12/19/2019  I spoke to the patient and his wife, Shawn Reese [phone]), about the findings on PE, CT scan, and Dr. Bosie Clos.    The major risk of surgery includes bleeding, infection, more ischemic bowel, and wound healing issues.  Will plan abdominal exploration, sigmoid colectomy, end colostomy.  Ovidio Kin, MD, [facility] Surgery Office phone:  [phone]

## 2019-12-19 NOTE — Op Note (Addendum)
12/19/2019  6:34 PM  PATIENT:  Shawn Reese, 59 y.o., male, MRN: [id number]  PREOP DIAGNOSIS:  Bowel obstruction  POSTOP DIAGNOSIS:   Proximal sigmoid colon obstruction, possible left colon colitis  PROCEDURE:   Procedure(s):  EXPLORATORY LAPAROTOMY, Resection of distal left colon/proximal sigmoid colon WITH END COLOSTOMY  SURGEON:   Ovidio Kin, M.D.  ASSISTANT:   A. Bedelia Person, M.D.  ANESTHESIA:   general  Anesthesiologist: Leonides Grills, MD CRNA: Epimenio Sarin, CRNA  General  EBL:  150  ml  BLOOD ADMINISTERED: none  DRAINS: none   LOCAL MEDICATIONS USED:   None  SPECIMEN:   Distal left colon/proximal sigmoid colon - suture distal  COUNTS CORRECT:  YES  INDICATIONS FOR PROCEDURE:  Tuff Clabo is a 59 y.o. (DOB: Apr 23, 1961) white male whose primary care physician is Patient, No Pcp Per and comes for sigmoid colon obstruction.   He presented yesterday with a sigmoid colon obstruction.  Dr. Bosie Clos did a sigmoidoscopy today which shows an obstructing area in the sigmoid colon with possible ischemia proximal to the obstruction.   The indications and risks of the surgery were explained to the patient.  The risks include, but are not limited to, infection, bleeding, and nerve injury.  PROCEDURE: The patient was taken to room #[facility] at [facility].  He had an NG tube in place, a Foley catheter placed, and his abdomen was prepped with ChloraPrep and draped.  A time out was held and the surgical checklist was run.  I made a midline incision in the lower abdomen and entered the abdominal cavity.  He had about 500 cc of straw-colored ascitic fluid.  His small bowel was dilated.  His colon was obstructed by a mass at the proximal sigmoid colon.  An NG tube was in place with the stomach otherwise normal.  The liver palpated was normal.  He had no other mass or nodule within his abdomen.  I spent 20 minutes milking air and fluid out of his small bowel back to his  stomach.  This helped decompress the small bowel to make his abdominal exploration more manageable.  I then mobilized the sigmoid colon mass and divided the colon distally to the mass by about 5 cm with a green Contour stapler.  The mass did not have the appearance of malignancy, but was clearly the source of obstruction.  Identified the left ureter in the retroperitoneum and stayed anterior to this.  I then mobilized the left colon up towards the splenic flexure, but did not reach the splenic flexure.  I divided the left colon approximately 14 cm proximal to the obstruction with a black 60 mm Ethicon Eschelon stapler.  I then mobilized the left colon to get it to the left upper quadrant abdominal wall.  I did not mobilize the splenic flexure.  I made an incision in the left upper quadrant cutting out a 2.5 centimeter circle of skin in the subcutaneous fat.  I made a cruciate incision in the rectus fascia and entered the abdominal cavity.  I brought up the future ostomy through this wound.  Of note, the left colon was very thickened.  I tacked the colon to the anterior abdominal wall with 2-0 Vicryl sutures.  I then irrigated the abdomen with 3 L of saline.  I closed the midline incision.  The peritoneum inferiorly was closed with 2-0 Vicryl suture.  The fascia was closed with 2 running #1 PDS sutures.  I loosely closed the skin with staples  and saline gauze.  I then matured the colon in  the left upper quadrant ostomy with interrupted 3-0 Vicryl sutures.  Of note, the left colon was thickened and  hyperemic.  It did not look ischemic. There were multiple enlarged nodes in the mesentery of the sigmoid colon and left colon.  I wonder whether the process he has was an acute colitis which precipitated an obstruction at his sigmoid colon where he had a narrowing.    The patient tolerated the procedure well was transferred to recovery in good condition.  I will try to put him in stepdown tonight, but there  are no ICU beds in the hospital at this time.  Alphonsa Overall, MD, [facility] Surgery Office phone:  [phone]

## 2019-12-19 NOTE — Anesthesia Postprocedure Evaluation (Signed)
Anesthesia Post Note  Patient: Belford Pascucci  Procedure(s) Performed: FLEXIBLE SIGMOIDOSCOPY (N/A ) SUBMUCOSAL TATTOO INJECTION     Patient location during evaluation: Endoscopy Anesthesia Type: General Level of consciousness: awake Pain management: pain level controlled Vital Signs Assessment: post-procedure vital signs reviewed and stable Respiratory status: spontaneous breathing, nonlabored ventilation, respiratory function stable and patient connected to nasal cannula oxygen Cardiovascular status: blood pressure returned to baseline and stable Postop Assessment: no apparent nausea or vomiting Anesthetic complications: no Comments: Patient going to OR for exploratory laparotomy.     Last Vitals:  Vitals:   12/19/19 1520 12/19/19 1530  BP: (!) 143/87 (!) 138/98  Pulse: 90 89  Resp: (!) 22 (!) 23  Temp:    SpO2: 93% 91%    Last Pain:  Vitals:   12/19/19 1507  TempSrc: Oral  PainSc: 0-No pain                 Jailey Booton P Chaunice Obie

## 2019-12-19 NOTE — Transfer of Care (Signed)
Immediate Anesthesia Transfer of Care Note  Patient: Shawn Reese  Procedure(s) Performed: EXPLORATORY LAPAROTOMY, BOWEL OBSTRUCTION WITH END COLOSTOMY (N/A Abdomen)  Patient Location: PACU  Anesthesia Type:General  Level of Consciousness: drowsy and patient cooperative  Airway & Oxygen Therapy: Patient Spontanous Breathing and Patient connected to face mask oxygen  Post-op Assessment: Report given to RN and Post -op Vital signs reviewed and stable  Post vital signs: Reviewed and stable  Last Vitals:  Vitals Value Taken Time  BP 133/83 12/19/19 1830  Temp    Pulse 85 12/19/19 1835  Resp 18 12/19/19 1835  SpO2 95 % 12/19/19 1835  Vitals shown include unvalidated device data.  Last Pain:  Vitals:   12/19/19 1507  TempSrc: Oral  PainSc: 0-No pain      Patients Stated Pain Goal: 2 (12/19/19 6151)  Complications: No apparent anesthesia complications

## 2019-12-19 NOTE — Anesthesia Preprocedure Evaluation (Addendum)
Anesthesia Evaluation  Patient identified by MRN, date of birth, ID band Patient awake    Reviewed: Allergy & Precautions, NPO status , Patient's Chart, lab work & pertinent test results  Airway Mallampati: II  TM Distance: >3 FB Neck ROM: Full    Dental no notable dental hx.    Pulmonary Current Smoker and Patient abstained from smoking.,    Pulmonary exam normal breath sounds clear to auscultation       Cardiovascular negative cardio ROS Normal cardiovascular exam Rhythm:Regular Rate:Normal     Neuro/Psych negative neurological ROS  negative psych ROS   GI/Hepatic Neg liver ROS, Colon obstruction  Bowel obstruction     Endo/Other  negative endocrine ROS  Renal/GU negative Renal ROS     Musculoskeletal negative musculoskeletal ROS (+)   Abdominal (+)  Abdomen: tender.  Distended abdomen  Peds  Hematology negative hematology ROS (+)   Anesthesia Other Findings Bowel obstruction; Abdominal pain; Diarrhea  Reproductive/Obstetrics                            Anesthesia Physical Anesthesia Plan  ASA: II  Anesthesia Plan: General   Post-op Pain Management:    Induction: Intravenous and Rapid sequence  PONV Risk Score and Plan: 1 and Ondansetron, Dexamethasone and Treatment may vary due to age or medical condition  Airway Management Planned: Oral ETT  Additional Equipment:   Intra-op Plan:   Post-operative Plan: Extubation in OR  Informed Consent: I have reviewed the patients History and Physical, chart, labs and discussed the procedure including the risks, benefits and alternatives for the proposed anesthesia with the patient or authorized representative who has indicated his/her understanding and acceptance.     Dental advisory given  Plan Discussed with: CRNA  Anesthesia Plan Comments:        Anesthesia Quick Evaluation

## 2019-12-19 NOTE — Anesthesia Preprocedure Evaluation (Signed)
Anesthesia Evaluation  Patient identified by MRN, date of birth, ID band Patient awake    Reviewed: Allergy & Precautions, NPO status , Patient's Chart, lab work & pertinent test results  Airway Mallampati: II  TM Distance: >3 FB Neck ROM: Full    Dental no notable dental hx.    Pulmonary Current Smoker and Patient abstained from smoking.,    Pulmonary exam normal breath sounds clear to auscultation       Cardiovascular negative cardio ROS Normal cardiovascular exam Rhythm:Regular Rate:Normal     Neuro/Psych negative neurological ROS  negative psych ROS   GI/Hepatic Neg liver ROS, Colon obstruction  Bowel obstruction     Endo/Other  negative endocrine ROS  Renal/GU negative Renal ROS     Musculoskeletal negative musculoskeletal ROS (+)   Abdominal (+)  Abdomen: tender.  Distended abdomen  Peds  Hematology negative hematology ROS (+)   Anesthesia Other Findings Bowel obstruction; Abdominal pain; Diarrhea  Reproductive/Obstetrics                             Anesthesia Physical  Anesthesia Plan  ASA: II and emergent  Anesthesia Plan: General   Post-op Pain Management:    Induction: Intravenous and Rapid sequence  PONV Risk Score and Plan: 1 and Ondansetron, Dexamethasone and Treatment may vary due to age or medical condition  Airway Management Planned: Oral ETT  Additional Equipment:   Intra-op Plan:   Post-operative Plan: Extubation in OR  Informed Consent: I have reviewed the patients History and Physical, chart, labs and discussed the procedure including the risks, benefits and alternatives for the proposed anesthesia with the patient or authorized representative who has indicated his/her understanding and acceptance.     Dental advisory given and Consent reviewed with POA  Plan Discussed with: CRNA  Anesthesia Plan Comments: (Anesthetic plan discussed with  patient as well as wife via telephone.)        Anesthesia Quick Evaluation

## 2019-12-19 NOTE — Brief Op Note (Signed)
Severe ischemic bowel with large polypoid lesions causing obstruction in sigmoid colon that may be due to a large lipoma. See endopro note for details. Surgery indicated. D/W Dr. Ezzard Standing. Updated wife by phone.

## 2019-12-19 NOTE — Consult Note (Signed)
Referring Provider: Dr. Barry Dienes Primary Care Physician:  Patient, No Pcp Per Primary Gastroenterologist:  Althia Forts  Reason for Consultation:  Bowel obstruction  HPI: Shawn Reese is a 59 y.o. male who developed the acute onset of profuse N/V/D while on vacation in the [place] Symptoms started day before he left one week ago. Had profuse nonbloody diarrhea and lower abdominal cramping (L > R) but denies any abdominal pain now. Feels very distended. Chills. No fever. No BM in 2 days. Hgb 13.7. Colonoscopy in 08/2015 in [facility] (Dr. Dolphus Jenny) where 2 large hyperplastic sigmoid polyps were noted with one being partially removed and the other being biopsied. Diffuse diverticulosis and internal hemorrhoids noted. EGD in 2017 showed short segment Barrett's esophagus and a hiatal hernia.  History reviewed. No pertinent past medical history.  History reviewed. No pertinent surgical history.  Prior to Admission medications   Medication Sig Start Date End Date Taking? Authorizing Provider  ibuprofen (ADVIL) 200 MG tablet Take 400 mg by mouth every 6 (six) hours as needed for fever, headache, mild pain, moderate pain or cramping.   Yes [provider]    Scheduled Meds: . enoxaparin (LOVENOX) injection  40 mg Subcutaneous Q24H   Continuous Infusions: . sodium chloride 1,000 mL (12/18/19 2026)  . cefTRIAXone (ROCEPHIN)  IV    . lactated ringers 125 mL/hr at 12/19/19 1053  . metronidazole 500 mg (12/19/19 0528)   PRN Meds:.albuterol, morphine injection, morphine injection, ondansetron (ZOFRAN) IV, ondansetron **OR** ondansetron (ZOFRAN) IV  Allergies as of 12/18/2019  . (No Known Allergies)    No family history on file.  Social History   Socioeconomic History  . Marital status: Married    Spouse name: Not on file  . Number of children: Not on file  . Years of education: Not on file  . Highest education level: Not on file  Occupational History  . Not on file   Tobacco Use  . Smoking status: Current Every Day Smoker  . Smokeless tobacco: Never Used  Substance and Sexual Activity  . Alcohol use: Yes  . Drug use: Never  . Sexual activity: Not on file  Other Topics Concern  . Not on file  Social History Narrative  . Not on file   Social Determinants of Health   Financial Resource Strain:   . Difficulty of Paying Living Expenses:   Food Insecurity:   . Worried About Charity fundraiser in the Last Year:   . Arboriculturist in the Last Year:   Transportation Needs:   . Film/video editor (Medical):   Marland Kitchen Lack of Transportation (Non-Medical):   Physical Activity:   . Days of Exercise per Week:   . Minutes of Exercise per Session:   Stress:   . Feeling of Stress :   Social Connections:   . Frequency of Communication with Friends and Family:   . Frequency of Social Gatherings with Friends and Family:   . Attends Religious Services:   . Active Member of Clubs or Organizations:   . Attends Archivist Meetings:   Marland Kitchen Marital Status:   Intimate Partner Violence:   . Fear of Current or Ex-Partner:   . Emotionally Abused:   Marland Kitchen Physically Abused:   . Sexually Abused:     Review of Systems: All negative except as stated above in HPI.  Physical Exam: Vital signs: Vitals:   12/19/19 0614 12/19/19 0906  BP: 133/82 132/87  Pulse: 79 84  Resp: 18  18  Temp: 98.7 F (37.1 C) 98.2 F (36.8 C)  SpO2: 96% 94%   Last BM Date: 12/16/19 General:   Lethargic, no acute distress, thin, pleasant  Head: normocephalic, atraumatic Eyes: anicteric sclera ENT: oropharynx clear Neck: supple, nontender Lungs:  Clear throughout to auscultation.   No wheezes, crackles, or rhonchi. No acute distress. Heart:  Regular rate and rhythm; no murmurs, clicks, rubs,  or gallops. Abdomen: marked distention, nontender, high-pitched bowel sounds  Rectal:  Deferred Ext: no edema  GI:  Lab Results: Recent Labs    12/18/19 1654 12/19/19 0221  WBC  22.3* 19.5*  HGB 15.8 13.7  HCT 45.7 41.6  PLT 346 289   BMET Recent Labs    12/18/19 1654 12/19/19 0221  NA 130* 135  K 3.3* 3.4*  CL 92* 101  CO2 26 26  GLUCOSE 128* 112*  BUN 18 15  CREATININE 0.70 0.72  CALCIUM 8.4* 7.8*   LFT Recent Labs    12/19/19 0221  PROT 5.5*  ALBUMIN 2.6*  AST 10*  ALT 17  ALKPHOS 70  BILITOT 0.8   PT/INR Recent Labs    12/19/19 0221  LABPROT 14.0  INR 1.1     Studies/Results: DG Abd 1 View  Result Date: 12/19/2019 CLINICAL DATA:  Abdominal pain EXAM: ABDOMEN - 1 VIEW COMPARISON:  CT Dec 18, 2019 FINDINGS: Dilated air-filled loops of colon are seen throughout, predominantly the transverse colon measuring up to 7.1 cm. Air-filled loops of nondilated small bowel seen within the mid abdomen. There is air in nondilated descending colon down to the level of the distal descending colon where there is an abrupt cutoff, likely due to the focal bowel wall narrowing as seen on recent CT. IMPRESSION: Mildly dilated colon to the level of the descending colon consistent with large bowel obstruction. Electronically Signed   By: Jonna Clark M.D.   On: 12/19/2019 03:58   CT Abdomen Pelvis W Contrast  Result Date: 12/18/2019 CLINICAL DATA:  Abdomen distension diarrhea EXAM: CT ABDOMEN AND PELVIS WITH CONTRAST TECHNIQUE: Multidetector CT imaging of the abdomen and pelvis was performed using the standard protocol following bolus administration of intravenous contrast. CONTRAST:  OMNIPAQUE IOHEXOL 300 MG/ML  SOLN COMPARISON:  None. FINDINGS: Lower chest: Lung bases demonstrate subsegmental atelectasis at the right base. No consolidation or pleural effusion. Cardiac size within normal limits. Hepatobiliary: Subcentimeter hypodensity in the left hepatic lobe too small to characterize. No calcified gallstone or biliary dilatation. Pancreas: Unremarkable. No pancreatic ductal dilatation or surrounding inflammatory changes. Spleen: Normal in size without focal  abnormality. Adrenals/Urinary Tract: Mild nodular thickening of the adrenal glands without dominant mass. Multiple subcentimeter hypodense renal lesions too small to further characterize. No hydronephrosis. The bladder is normal Stomach/Bowel: The stomach is nonenlarged. Fluid distended nondilated terminal ileum. Air and fluid distension of the: Measuring up to 9.2 cm. Wall thickening of the descending colon with surrounding inflammatory process. Irregular wall thickening at the sigmoid colon serving as the point of transition. Diffuse diverticular disease of the colon. Negative appendix. Linear peripherally oriented gas collections within the ascending and transverse colon raising concern for intramural air. Negative for portal venous gas. Soft tissue stranding about the right colon as well. Possible small intramural gas and fluid collections within the sigmoid:, for example series 2, image number 77, left-sided 2.5 cm gas and fluid collection. Additional 18 mm gas and fluid collection on the right side, series 2, image number 83. Vascular/Lymphatic: Moderate aortic atherosclerosis. No aneurysm. Visible  portions of the SMA and SMV enhance normally. No suspicious adenopathy Reproductive: Prostate is unremarkable. Other: No free air. Small free fluid in the pelvis. Small amount of fluid in the pararenal spaces. Musculoskeletal: No acute or suspicious osseous abnormality. IMPRESSION: 1. Findings consistent with colon obstruction. Point of transition is at the sigmoid colon where there is irregular masslike narrowing of the bowel with bowel wall thickening. Primary concern is obstructing neoplasm, though inflammatory stricture is also considered given the presence of diverticular disease in the colon. Potential small intramural abscess within the sigmoid colon upstream and downstream to the point of obstruction. Concern raised for intramural air involving the ascending and transverse colon, given appearance of linear  peripherally distributed gas collections. No portal venous gas or free air visible at this time. 2. Mild colon wall thickening/colitis of the descending colon with surrounding inflammatory changes. Pericolonic inflammatory changes involving the right colon as well though without substantial colon wall thickening at this location. 3. Free fluid in the pelvis. Critical Value/emergent results were called by telephone at the time of interpretation on 12/18/2019 at 7:53 pm to provider [facility] , who verbally acknowledged these results. Electronically Signed   By: Jasmine Pang M.D.   On: 12/18/2019 19:54    Impression/Plan: Distal colonic obstruction question malignancy vs large lipoma vs large polyp but would be rare for a large polyp to cause complete colonic or near complete colonic obstruction unless mass present. At the request of surgery (Dr. Donell Beers) will attempt a colonic decompression and assess etiology of this bowel obstruction. Unprepped flex sig today. Informed consent obtained from patient.    LOS: 1 day   [name]  12/19/2019, 12:38 PM  Questions please call [phone]

## 2019-12-19 NOTE — Anesthesia Procedure Notes (Signed)
Procedure Name: Intubation Date/Time: 12/19/2019 4:11 PM Performed by: [name], CRNA Pre-anesthesia Checklist: Patient identified, Emergency Drugs available, Suction available, Patient being monitored and Timeout performed Patient Re-evaluated:Patient Re-evaluated prior to induction Oxygen Delivery Method: Circle system utilized Preoxygenation: Pre-oxygenation with 100% oxygen Induction Type: IV induction and Rapid sequence Laryngoscope Size: Mac and 3 Grade View: Grade I Tube type: Oral Tube size: 7.5 mm Number of attempts: 1 Airway Equipment and Method: Stylet Placement Confirmation: ETT inserted through vocal cords under direct vision,  positive ETCO2 and breath sounds checked- equal and bilateral Secured at: 23 cm Tube secured with: Tape Dental Injury: Teeth and Oropharynx as per pre-operative assessment

## 2019-12-19 NOTE — H&P (Addendum)
History and Physical    Shawn Reese WNI:[id number] DOB: [birth year] DOA: 12/18/2019  PCP: Patient, No Pcp Per  Patient coming from: Home I have personally briefly reviewed patient's old medical records in [facility]  Chief Complaint: n/v/diarrhea x 6 days   HPI: Shawn Reese is a 59 y.o. male with no significant past medical history who presents to ed with history of 6 days of n/v/diarrhea that began the day prior to his departure home from vacation in [place]. He notes no other members of his party with similar illness, no other sick contacts or prior episodes of such illness in the past. He notes associated chills and sweats but has note no fever at home. He states the first 3 days he has persistent diarrhea /nausea and dry heaves. He states he believes there may have been some associated bloody stools as well. He notes no presyncope no palpitations. He states his abdominal pain is diffuse and is at a 3 on scale of 0-10. He notes that he is still able to make urine and noted it is dark yellow in color but notes no associated pain. He does states that is diarrhea stopped 3 days ago and he has not had a bowel movement since although he does endorse flatus. He also notes history of colonic polyps on his last c-scope 5 years ago.  ED Course:  Temp 99.3, bp 142/91, hr 73, rr18, sat 98% on ra  Wbc 22.2, Na130,k3.3, cr 0.7,ast,16,alt21,alkphos86, UA:+ bacteria, no wbc   CT scan:  1. Findings consistent with colon obstruction. Point of transition is at the sigmoid colon where there is irregular masslike narrowing of the bowel with bowel wall thickening. Primary concern is obstructing neoplasm, though inflammatory stricture is also considered given the presence of diverticular disease in the colon. Potential small intramural abscess within the sigmoid colon upstream and downstream to the point of obstruction. Concern raised for intramural air involving the ascending and  transverse colon, given appearance of linear peripherally distributed gas collections. No portal venous gas or free air visible at this time. 2. Mild colon wall thickening/colitis of the descending colon with surrounding inflammatory changes. Pericolonic inflammatory changes involving the right colon as well though without substantial colon wall thickening at this location. 3. Free fluid in the pelvis.  Surgery consulted who recommended admission to hospitalist service with surgical consult on arrival. On call surgery at this time did not recommend any form of decompression Patient was treated with  2L ivf in ed, morphine, zosyn and ceftriaxone.  Review of Systems: As per HPI otherwise 10 point review of systems negative.   History reviewed. No pertinent past medical history.  History reviewed. No pertinent surgical history.   reports that he has been smoking. He has never used smokeless tobacco. He reports current alcohol use. He reports that he does not use drugs.  No Known Allergies  No family history on file. HTN :father Rockey Situ   Prior to Admission medications   Medication Sig Start Date End Date Taking? Authorizing Provider  ibuprofen (ADVIL) 200 MG tablet Take 400 mg by mouth every 6 (six) hours as needed for fever, headache, mild pain, moderate pain or cramping.   Yes [provider]    Physical Exam: Vitals:   12/18/19 1643 12/18/19 1933 12/18/19 2133 12/18/19 2338  BP: (!) 142/91 128/81 127/90 136/85  Pulse: 73 90 81 80  Resp: 18 16 (!) 21 20  Temp: 99.3 F (37.4 C)   98.3 F (36.8  C)  TempSrc: Oral   Oral  SpO2: 98% 96% 93% 96%  Weight: 74.8 kg     Height: 5\' 7"  (1.702 m)       Constitutional: NAD, calm, comfortable Vitals:   12/18/19 1643 12/18/19 1933 12/18/19 2133 12/18/19 2338  BP: (!) 142/91 128/81 127/90 136/85  Pulse: 73 90 81 80  Resp: 18 16 (!) 21 20  Temp: 99.3 F (37.4 C)   98.3 F (36.8 C)  TempSrc: Oral   Oral  SpO2: 98% 96%  93% 96%  Weight: 74.8 kg     Height: 5\' 7"  (1.702 m)      Eyes: PERRL, lids and conjunctivae normal ENMT: Mucous membranes are dry. Posterior pharynx clear of any exudate or lesions.Normal dentition.  Neck: normal, supple, no masses, no thyromegaly Respiratory: clear to auscultation bilaterally, no wheezing, no crackles. Normal respiratory effort. No accessory muscle use.  Cardiovascular: Regular rate and rhythm, no murmurs / rubs / gallops. No extremity edema. 2+ pedal pulses. No carotid bruits.  Abdomen: diffuse tenderness, distended, high pitched tinkling sounds lower quad R>L, voluntary guarding no rebound.  Musculoskeletal: no clubbing / cyanosis. No joint deformity upper and lower extremities. Good ROM, no contractures. Normal muscle tone.  Skin: no rashes, lesions, ulcers. No induration Neurologic: CN 2-12 grossly intact. Sensation intact, DTR normal. Strength 5/5 in all 4.  Psychiatric: Normal judgment and insight. Alert and oriented x 3. Normal mood.    Labs on Admission: I have personally reviewed following labs and imaging studies  CBC: Recent Labs  Lab 12/18/19 1654  WBC 22.3*  NEUTROABS 17.1*  HGB 15.8  HCT 45.7  MCV 89.4  PLT 346   Basic Metabolic Panel: Recent Labs  Lab 12/18/19 1654  NA 130*  K 3.3*  CL 92*  CO2 26  GLUCOSE 128*  BUN 18  CREATININE 0.70  CALCIUM 8.4*   GFR: Estimated Creatinine Clearance: 91.8 mL/min (by C-G formula based on SCr of 0.7 mg/dL). Liver Function Tests: Recent Labs  Lab 12/18/19 1654  AST 16  ALT 21  ALKPHOS 86  BILITOT 1.0  PROT 6.9  ALBUMIN 3.3*   Recent Labs  Lab 12/18/19 1654  LIPASE 20   No results for input(s): AMMONIA in the last 168 hours. Coagulation Profile: No results for input(s): INR, PROTIME in the last 168 hours. Cardiac Enzymes: No results for input(s): CKTOTAL, CKMB, CKMBINDEX, TROPONINI in the last 168 hours. BNP (last 3 results) No results for input(s): PROBNP in the last 8760  hours. HbA1C: No results for input(s): HGBA1C in the last 72 hours. CBG: No results for input(s): GLUCAP in the last 168 hours. Lipid Profile: No results for input(s): CHOL, HDL, LDLCALC, TRIG, CHOLHDL, LDLDIRECT in the last 72 hours. Thyroid Function Tests: No results for input(s): TSH, T4TOTAL, FREET4, T3FREE, THYROIDAB in the last 72 hours. Anemia Panel: No results for input(s): VITAMINB12, FOLATE, FERRITIN, TIBC, IRON, RETICCTPCT in the last 72 hours. Urine analysis:    Component Value Date/Time   COLORURINE YELLOW 12/18/2019 1654   APPEARANCEUR CLEAR 12/18/2019 1654   LABSPEC 1.025 12/18/2019 1654   PHURINE 6.0 12/18/2019 1654   GLUCOSEU NEGATIVE 12/18/2019 1654   HGBUR NEGATIVE 12/18/2019 1654   BILIRUBINUR SMALL (A) 12/18/2019 1654   KETONESUR NEGATIVE 12/18/2019 1654   PROTEINUR 30 (A) 12/18/2019 1654   NITRITE NEGATIVE 12/18/2019 1654   LEUKOCYTESUR NEGATIVE 12/18/2019 1654    Radiological Exams on Admission: CT Abdomen Pelvis W Contrast  Result Date: 12/18/2019 CLINICAL DATA:  Abdomen distension diarrhea EXAM: CT ABDOMEN AND PELVIS WITH CONTRAST TECHNIQUE: Multidetector CT imaging of the abdomen and pelvis was performed using the standard protocol following bolus administration of intravenous contrast. CONTRAST:  OMNIPAQUE IOHEXOL 300 MG/ML  SOLN COMPARISON:  None. FINDINGS: Lower chest: Lung bases demonstrate subsegmental atelectasis at the right base. No consolidation or pleural effusion. Cardiac size within normal limits. Hepatobiliary: Subcentimeter hypodensity in the left hepatic lobe too small to characterize. No calcified gallstone or biliary dilatation. Pancreas: Unremarkable. No pancreatic ductal dilatation or surrounding inflammatory changes. Spleen: Normal in size without focal abnormality. Adrenals/Urinary Tract: Mild nodular thickening of the adrenal glands without dominant mass. Multiple subcentimeter hypodense renal lesions too small to further  characterize. No hydronephrosis. The bladder is normal Stomach/Bowel: The stomach is nonenlarged. Fluid distended nondilated terminal ileum. Air and fluid distension of the: Measuring up to 9.2 cm. Wall thickening of the descending colon with surrounding inflammatory process. Irregular wall thickening at the sigmoid colon serving as the point of transition. Diffuse diverticular disease of the colon. Negative appendix. Linear peripherally oriented gas collections within the ascending and transverse colon raising concern for intramural air. Negative for portal venous gas. Soft tissue stranding about the right colon as well. Possible small intramural gas and fluid collections within the sigmoid:, for example series 2, image number 77, left-sided 2.5 cm gas and fluid collection. Additional 18 mm gas and fluid collection on the right side, series 2, image number 83. Vascular/Lymphatic: Moderate aortic atherosclerosis. No aneurysm. Visible portions of the SMA and SMV enhance normally. No suspicious adenopathy Reproductive: Prostate is unremarkable. Other: No free air. Small free fluid in the pelvis. Small amount of fluid in the pararenal spaces. Musculoskeletal: No acute or suspicious osseous abnormality. IMPRESSION: 1. Findings consistent with colon obstruction. Point of transition is at the sigmoid colon where there is irregular masslike narrowing of the bowel with bowel wall thickening. Primary concern is obstructing neoplasm, though inflammatory stricture is also considered given the presence of diverticular disease in the colon. Potential small intramural abscess within the sigmoid colon upstream and downstream to the point of obstruction. Concern raised for intramural air involving the ascending and transverse colon, given appearance of linear peripherally distributed gas collections. No portal venous gas or free air visible at this time. 2. Mild colon wall thickening/colitis of the descending colon with surrounding  inflammatory changes. Pericolonic inflammatory changes involving the right colon as well though without substantial colon wall thickening at this location. 3. Free fluid in the pelvis. Critical Value/emergent results were called by telephone at the time of interpretation on 12/18/2019 at 7:53 pm to provider [facility] , who verbally acknowledged these results. Electronically Signed   By: Jasmine Pang M.D.   On: 12/18/2019 19:54    EKG: Independently reviewed. 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 -broad spectrum abx /ivfs/ surgery consult aware  -no decompression per surgery needed at this time - lactic wnl will continue to monitor - f/u on blood cultures /gi pcr panel -monitor uop closely  -agree with infectious disease consultation  For further assistance   Colon obstruction  -due to above infection and severe inflammation  -repeat KUB now to ensure no free air since CT abd completed hours ago -await final surgery recs   FEN Hyponatremia  -due to low volume continue with ivfs  Hypokalemia  -replete prn  -place on electrolyte protocol   DVT prophylaxis: heparin  Code Status: full Family Communication:N/A Disposition Plan: 4-5 days  Consults called: Surgery [place]  Admission status: inpatient   Clance Boll MD [facility]  If 7PM-7AM, please contact night-coverage [email or web address] Password [email or web address]  12/19/2019, 1:24 AM

## 2019-12-19 NOTE — Op Note (Signed)
[facility] Patient Name: Shawn Reese Procedure Date: 12/19/2019 MRN: [id number] Attending MD: [name] , MD Date of Birth: 02/02/1961 CSN: [id number] Age: 59 Admit Type: Inpatient Procedure:                Flexible Sigmoidoscopy Indications:              Abdominal pain in the left lower quadrant, Diarrhea Providers:                [name], MD, Norman Clay, RN, Beryle Beams, Technician, Waymond Cera, CRNA Referring MD:             hospital team Medicines:                Propofol per Anesthesia, Monitored Anesthesia Care Complications:            No immediate complications. Estimated Blood Loss:     Estimated blood loss: none. Procedure:                Pre-Anesthesia Assessment:                           - Prior to the procedure, a History and Physical                            was performed, and patient medications and                            allergies were reviewed. The patient's tolerance of                            previous anesthesia was also reviewed. The risks                            and benefits of the procedure and the sedation                            options and risks were discussed with the patient.                            All questions were answered, and informed consent                            was obtained. Prior Anticoagulants: The patient has                            taken no previous anticoagulant or antiplatelet                            agents. ASA Grade Assessment: II - A patient with                            mild systemic disease. After reviewing the risks  and benefits, the patient was deemed in                            satisfactory condition to undergo the procedure.                           After obtaining informed consent, the scope was                            passed under direct vision. The PCF-H190DL                            ([id number])  Olympus pediatric colonscope was                            introduced through the anus and advanced to the the                            descending colon. The flexible sigmoidoscopy was                            performed with difficulty due to a partially                            obstructing mass. Scope In: 2:32:45 PM Scope Out: 2:54:01 PM Total Procedure Duration: 0 hours 21 minutes 16 seconds  Findings:      The perianal and digital rectal examinations were normal.      A diffuse area of severely congested, erythematous, inflamed, ulcerated,       vascular-pattern-decreased and thickened folds of the mucosa was found       in the sigmoid colon and in the descending colon.      A polypoid partially obstructing large mass was found in the sigmoid       colon and at 30 cm proximal to the anus. The mass was circumferential.       No bleeding was present. Area was tattooed with an injection of 2 mL of       Spot (carbon black).      Internal hemorrhoids were found during retroflexion. The hemorrhoids       were small and Grade I (internal hemorrhoids that do not prolapse).      Large partially obstructing polypoid lesions with dilated bowel       proximally that was moderately ischemic with areas of darkened mucosa       and severely ischemic in the descending colon. Due to concern for       perforation the procedure was terminated. Impression:               - Congested, erythematous, inflamed, ulcerated,                            vascular-pattern-decreased and thickened folds of                            the mucosa in the sigmoid colon and in the  descending colon.                           - Rule out malignancy, partially obstructing tumor                            in the sigmoid colon and at 30 cm proximal to the                            anus. Tattooed.                           - Internal hemorrhoids.                           - No specimens  collected. Moderate Sedation:      Not Applicable - Patient had care per Anesthesia. Recommendation:           - Discussed with Dr. Lucia Gaskins and surgery planned for                            the ischemic bowel.                           - NPO. Procedure Code(s):        --- Professional ---                           [id number], Sigmoidoscopy, flexible; with directed                            submucosal injection(s), any substance Diagnosis Code(s):        --- Professional ---                           R10.32, Left lower quadrant pain                           D49.0, Neoplasm of unspecified behavior of                            digestive system                           K63.3, Ulcer of intestine                           R19.7, Diarrhea, unspecified                           K52.9, Noninfective gastroenteritis and colitis,                            unspecified                           K63.89, Other specified diseases of intestine  K56.690, Other partial intestinal obstruction                           K64.0, First degree hemorrhoids CPT copyright 2019 [employer]. All rights reserved. The codes documented in this report are preliminary and upon coder review may  be revised to meet current compliance requirements. [name], MD 12/19/2019 3:10:21 PM This report has been signed electronically. Number of Addenda: 0

## 2019-12-19 NOTE — H&P (View-Only) (Signed)
Referring Provider: Dr. Barry Dienes Primary Care Physician:  Patient, No Pcp Per Primary Gastroenterologist:  Althia Forts  Reason for Consultation:  Bowel obstruction  HPI: Mahin Guardia is a 59 y.o. male who developed the acute onset of profuse N/V/D while on vacation in the [place] Symptoms started day before he left one week ago. Had profuse nonbloody diarrhea and lower abdominal cramping (L > R) but denies any abdominal pain now. Feels very distended. Chills. No fever. No BM in 2 days. Hgb 13.7. Colonoscopy in 08/2015 in [facility] (Dr. Dolphus Jenny) where 2 large hyperplastic sigmoid polyps were noted with one being partially removed and the other being biopsied. Diffuse diverticulosis and internal hemorrhoids noted. EGD in 2017 showed short segment Barrett's esophagus and a hiatal hernia.  History reviewed. No pertinent past medical history.  History reviewed. No pertinent surgical history.  Prior to Admission medications   Medication Sig Start Date End Date Taking? Authorizing Provider  ibuprofen (ADVIL) 200 MG tablet Take 400 mg by mouth every 6 (six) hours as needed for fever, headache, mild pain, moderate pain or cramping.   Yes [provider]    Scheduled Meds: . enoxaparin (LOVENOX) injection  40 mg Subcutaneous Q24H   Continuous Infusions: . sodium chloride 1,000 mL (12/18/19 2026)  . cefTRIAXone (ROCEPHIN)  IV    . lactated ringers 125 mL/hr at 12/19/19 1053  . metronidazole 500 mg (12/19/19 0528)   PRN Meds:.albuterol, morphine injection, morphine injection, ondansetron (ZOFRAN) IV, ondansetron **OR** ondansetron (ZOFRAN) IV  Allergies as of 12/18/2019  . (No Known Allergies)    No family history on file.  Social History   Socioeconomic History  . Marital status: Married    Spouse name: Not on file  . Number of children: Not on file  . Years of education: Not on file  . Highest education level: Not on file  Occupational History  . Not on file   Tobacco Use  . Smoking status: Current Every Day Smoker  . Smokeless tobacco: Never Used  Substance and Sexual Activity  . Alcohol use: Yes  . Drug use: Never  . Sexual activity: Not on file  Other Topics Concern  . Not on file  Social History Narrative  . Not on file   Social Determinants of Health   Financial Resource Strain:   . Difficulty of Paying Living Expenses:   Food Insecurity:   . Worried About Charity fundraiser in the Last Year:   . Arboriculturist in the Last Year:   Transportation Needs:   . Film/video editor (Medical):   Marland Kitchen Lack of Transportation (Non-Medical):   Physical Activity:   . Days of Exercise per Week:   . Minutes of Exercise per Session:   Stress:   . Feeling of Stress :   Social Connections:   . Frequency of Communication with Friends and Family:   . Frequency of Social Gatherings with Friends and Family:   . Attends Religious Services:   . Active Member of Clubs or Organizations:   . Attends Archivist Meetings:   Marland Kitchen Marital Status:   Intimate Partner Violence:   . Fear of Current or Ex-Partner:   . Emotionally Abused:   Marland Kitchen Physically Abused:   . Sexually Abused:     Review of Systems: All negative except as stated above in HPI.  Physical Exam: Vital signs: Vitals:   12/19/19 0614 12/19/19 0906  BP: 133/82 132/87  Pulse: 79 84  Resp: 18  18  Temp: 98.7 F (37.1 C) 98.2 F (36.8 C)  SpO2: 96% 94%   Last BM Date: 12/16/19 General:   Lethargic, no acute distress, thin, pleasant  Head: normocephalic, atraumatic Eyes: anicteric sclera ENT: oropharynx clear Neck: supple, nontender Lungs:  Clear throughout to auscultation.   No wheezes, crackles, or rhonchi. No acute distress. Heart:  Regular rate and rhythm; no murmurs, clicks, rubs,  or gallops. Abdomen: marked distention, nontender, high-pitched bowel sounds  Rectal:  Deferred Ext: no edema  GI:  Lab Results: Recent Labs    12/18/19 1654 12/19/19 0221  WBC  22.3* 19.5*  HGB 15.8 13.7  HCT 45.7 41.6  PLT 346 289   BMET Recent Labs    12/18/19 1654 12/19/19 0221  NA 130* 135  K 3.3* 3.4*  CL 92* 101  CO2 26 26  GLUCOSE 128* 112*  BUN 18 15  CREATININE 0.70 0.72  CALCIUM 8.4* 7.8*   LFT Recent Labs    12/19/19 0221  PROT 5.5*  ALBUMIN 2.6*  AST 10*  ALT 17  ALKPHOS 70  BILITOT 0.8   PT/INR Recent Labs    12/19/19 0221  LABPROT 14.0  INR 1.1     Studies/Results: DG Abd 1 View  Result Date: 12/19/2019 CLINICAL DATA:  Abdominal pain EXAM: ABDOMEN - 1 VIEW COMPARISON:  CT Dec 18, 2019 FINDINGS: Dilated air-filled loops of colon are seen throughout, predominantly the transverse colon measuring up to 7.1 cm. Air-filled loops of nondilated small bowel seen within the mid abdomen. There is air in nondilated descending colon down to the level of the distal descending colon where there is an abrupt cutoff, likely due to the focal bowel wall narrowing as seen on recent CT. IMPRESSION: Mildly dilated colon to the level of the descending colon consistent with large bowel obstruction. Electronically Signed   By: Jonna Clark M.D.   On: 12/19/2019 03:58   CT Abdomen Pelvis W Contrast  Result Date: 12/18/2019 CLINICAL DATA:  Abdomen distension diarrhea EXAM: CT ABDOMEN AND PELVIS WITH CONTRAST TECHNIQUE: Multidetector CT imaging of the abdomen and pelvis was performed using the standard protocol following bolus administration of intravenous contrast. CONTRAST:  OMNIPAQUE IOHEXOL 300 MG/ML  SOLN COMPARISON:  None. FINDINGS: Lower chest: Lung bases demonstrate subsegmental atelectasis at the right base. No consolidation or pleural effusion. Cardiac size within normal limits. Hepatobiliary: Subcentimeter hypodensity in the left hepatic lobe too small to characterize. No calcified gallstone or biliary dilatation. Pancreas: Unremarkable. No pancreatic ductal dilatation or surrounding inflammatory changes. Spleen: Normal in size without focal  abnormality. Adrenals/Urinary Tract: Mild nodular thickening of the adrenal glands without dominant mass. Multiple subcentimeter hypodense renal lesions too small to further characterize. No hydronephrosis. The bladder is normal Stomach/Bowel: The stomach is nonenlarged. Fluid distended nondilated terminal ileum. Air and fluid distension of the: Measuring up to 9.2 cm. Wall thickening of the descending colon with surrounding inflammatory process. Irregular wall thickening at the sigmoid colon serving as the point of transition. Diffuse diverticular disease of the colon. Negative appendix. Linear peripherally oriented gas collections within the ascending and transverse colon raising concern for intramural air. Negative for portal venous gas. Soft tissue stranding about the right colon as well. Possible small intramural gas and fluid collections within the sigmoid:, for example series 2, image number 77, left-sided 2.5 cm gas and fluid collection. Additional 18 mm gas and fluid collection on the right side, series 2, image number 83. Vascular/Lymphatic: Moderate aortic atherosclerosis. No aneurysm. Visible  portions of the SMA and SMV enhance normally. No suspicious adenopathy Reproductive: Prostate is unremarkable. Other: No free air. Small free fluid in the pelvis. Small amount of fluid in the pararenal spaces. Musculoskeletal: No acute or suspicious osseous abnormality. IMPRESSION: 1. Findings consistent with colon obstruction. Point of transition is at the sigmoid colon where there is irregular masslike narrowing of the bowel with bowel wall thickening. Primary concern is obstructing neoplasm, though inflammatory stricture is also considered given the presence of diverticular disease in the colon. Potential small intramural abscess within the sigmoid colon upstream and downstream to the point of obstruction. Concern raised for intramural air involving the ascending and transverse colon, given appearance of linear  peripherally distributed gas collections. No portal venous gas or free air visible at this time. 2. Mild colon wall thickening/colitis of the descending colon with surrounding inflammatory changes. Pericolonic inflammatory changes involving the right colon as well though without substantial colon wall thickening at this location. 3. Free fluid in the pelvis. Critical Value/emergent results were called by telephone at the time of interpretation on 12/18/2019 at 7:53 pm to provider MARCY PFEIFFER , who verbally acknowledged these results. Electronically Signed   By: Kim  Fujinaga M.D.   On: 12/18/2019 19:54    Impression/Plan: Distal colonic obstruction question malignancy vs large lipoma vs large polyp but would be rare for a large polyp to cause complete colonic or near complete colonic obstruction unless mass present. At the request of surgery (Dr. Byerly) will attempt a colonic decompression and assess etiology of this bowel obstruction. Unprepped flex sig today. Informed consent obtained from patient.    LOS: 1 day   Emeline Simpson C Shanica Castellanos  12/19/2019, 12:38 PM  Questions please call [phone]  

## 2019-12-19 NOTE — Progress Notes (Signed)
Marland Kitchen  PROGRESS NOTE    Cuinn Westerhold  KKX:[id number] DOB: June 20, 1961 DOA: 12/18/2019 PCP: Patient, No Pcp Per   Brief Narrative:   pMN admission. See H&P for full details.    Assessment & Plan:   Active Problems:   Colon obstruction (HCC)   Bowel obstruction (HCC)  Infectious Colitis Colon obstruction  Leukocytosis     - rocephin, flagyl     - gen surgery and GI onboard; apprecaite assistance     - GI for flex sig today and possible decompression     - f/u on Bld Cx, GI panel, C diff panel     - bowel rest  Hyponatremia  Hypokalemia      - fluids     - replace K+, check Mg2+    DVT prophylaxis: lovenox Code Status: FULL Family Communication: Updated family at bedside   Status is: Inpatient  Remains inpatient appropriate because:IV treatments appropriate due to intensity of illness or inability to take PO   Dispo: The patient is from: Home              Anticipated d/c is to: Home              Anticipated d/c date is: 3 days              Patient currently is not medically stable to d/c.  Consultants:   GI  General Surgery  Antimicrobials:  . Rocephin, flagyl   ROS:  Reports N, ab distention. Remainder 10-pt ROS is negative for all not previously mentioned.  Subjective: "It was dry heaving."  Objective: Vitals:   12/19/19 0205 12/19/19 0614 12/19/19 0906 12/19/19 1333  BP: 123/84 133/82 132/87 (!) 141/92  Pulse: 77 79 84 84  Resp: 18 18 18 20   Temp: 98.5 F (36.9 C) 98.7 F (37.1 C) 98.2 F (36.8 C) 98 F (36.7 C)  TempSrc: Oral Oral Oral Oral  SpO2: 92% 96% 94% 94%  Weight:      Height:        Intake/Output Summary (Last 24 hours) at 12/19/2019 1340 Last data filed at 12/19/2019 1000 Gross per 24 hour  Intake 1406.12 ml  Output --  Net 1406.12 ml   Filed Weights   12/18/19 1643 12/19/19 0000  Weight: 74.8 kg 75.2 kg    Examination:  General: 59 y.o. male resting in bed in NAD Cardiovascular: RRR, +S1, S2, no m/g/r, equal pulses  throughout Respiratory: CTABL, no w/r/r, normal WOB GI: BS+, distended, NT, no masses noted, no organomegaly noted MSK: No e/c/c Neuro: A&O x 3, no focal deficits Psyc: Appropriate interaction and affect, calm/cooperative  Data Reviewed: I have personally reviewed following labs and imaging studies.  CBC: Recent Labs  Lab 12/18/19 1654 12/19/19 0221  WBC 22.3* 19.5*  NEUTROABS 17.1* 16.4*  HGB 15.8 13.7  HCT 45.7 41.6  MCV 89.4 92.0  PLT 346 289   Basic Metabolic Panel: Recent Labs  Lab 12/18/19 1654 12/19/19 0221  NA 130* 135  K 3.3* 3.4*  CL 92* 101  CO2 26 26  GLUCOSE 128* 112*  BUN 18 15  CREATININE 0.70 0.72  CALCIUM 8.4* 7.8*   GFR: Estimated Creatinine Clearance: 91.8 mL/min (by C-G formula based on SCr of 0.72 mg/dL). Liver Function Tests: Recent Labs  Lab 12/18/19 1654 12/19/19 0221  AST 16 10*  ALT 21 17  ALKPHOS 86 70  BILITOT 1.0 0.8  PROT 6.9 5.5*  ALBUMIN 3.3* 2.6*   Recent Labs  Lab 12/18/19 1654  LIPASE 20   No results for input(s): AMMONIA in the last 168 hours. Coagulation Profile: Recent Labs  Lab 12/19/19 0221  INR 1.1   Cardiac Enzymes: No results for input(s): CKTOTAL, CKMB, CKMBINDEX, TROPONINI in the last 168 hours. BNP (last 3 results) No results for input(s): PROBNP in the last 8760 hours. HbA1C: No results for input(s): HGBA1C in the last 72 hours. CBG: Recent Labs  Lab 12/19/19 0727  GLUCAP 101*   Lipid Profile: No results for input(s): CHOL, HDL, LDLCALC, TRIG, CHOLHDL, LDLDIRECT in the last 72 hours. Thyroid Function Tests: Recent Labs    12/19/19 0221  TSH 1.193   Anemia Panel: No results for input(s): VITAMINB12, FOLATE, FERRITIN, TIBC, IRON, RETICCTPCT in the last 72 hours. Sepsis Labs: Recent Labs  Lab 12/18/19 1823 12/19/19 0221 12/19/19 0534  LATICACIDVEN 0.9 1.0 1.0    Recent Results (from the past 240 hour(s))  Culture, blood (routine x 2)     Status: None (Preliminary result)    Collection Time: 12/18/19  6:20 PM   Specimen: BLOOD  Result Value Ref Range Status   Specimen Description   Final    BLOOD RIGHT ANTECUBITAL Performed at [facility], [address] [place], [place] [address]    Special Requests   Final    BOTTLES DRAWN AEROBIC AND ANAEROBIC Blood Culture adequate volume Performed at [facility], [address] [place], [place] [address]    Culture   Final    NO GROWTH < 12 HOURS Performed at [facility], [address] [place], [place] [address]    Report Status PENDING  Incomplete  Culture, blood (routine x 2)     Status: None (Preliminary result)   Collection Time: 12/18/19  6:25 PM   Specimen: BLOOD  Result Value Ref Range Status   Specimen Description   Final    BLOOD LEFT ANTECUBITAL Performed at [facility], [address] [place], [place] [address]    Special Requests   Final    BOTTLES DRAWN AEROBIC AND ANAEROBIC Blood Culture adequate volume Performed at [facility], [address] [place], [place] [address]    Culture   Final    NO GROWTH < 12 HOURS Performed at [facility], [address] [place], [place] [address]    Report Status PENDING  Incomplete  SARS Coronavirus 2 by RT PCR (hospital order, performed in [facility] hospital lab) Nasopharyngeal Nasopharyngeal Swab     Status: None   Collection Time: 12/18/19  8:27 PM   Specimen: Nasopharyngeal Swab  Result Value Ref Range Status   SARS Coronavirus 2 NEGATIVE NEGATIVE Final    Comment: (NOTE) SARS-CoV-2 target nucleic acids are NOT DETECTED. The SARS-CoV-2 RNA is generally detectable in upper and lower respiratory specimens during the acute phase of infection. The lowest concentration of SARS-CoV-2 viral copies this assay can detect is 250 copies / mL. A negative result does not preclude SARS-CoV-2 infection and should not be used as the sole basis for treatment or other patient management decisions.  A  negative result may occur with improper specimen collection / handling, submission of specimen other than nasopharyngeal swab, presence of viral mutation(s) within the areas targeted by this assay, and inadequate number of viral copies (<250 copies / mL). A negative result must be combined with clinical observations, patient history, and epidemiological information. Fact Sheet for Patients:  StrictlyIdeas.no Fact Sheet for Healthcare Providers: BankingDealers.co.za This test is not yet approved or cleared  by the [place] FDA and has been authorized for detection and/or diagnosis of SARS-CoV-2 by FDA under an Emergency Use Authorization (EUA).  This EUA will remain in effect (meaning this test can be used) for the duration of the COVID-19 declaration under Section 564(b)(1) of the Act, 21 U.S.C. section 360bbb-3(b)(1), unless the authorization is terminated or revoked sooner. Performed at [facility], [address], [place], [place] [address]       Radiology Studies: DG Abd 1 View  Result Date: 12/19/2019 CLINICAL DATA:  Abdominal pain EXAM: ABDOMEN - 1 VIEW COMPARISON:  CT Dec 18, 2019 FINDINGS: Dilated air-filled loops of colon are seen throughout, predominantly the transverse colon measuring up to 7.1 cm. Air-filled loops of nondilated small bowel seen within the mid abdomen. There is air in nondilated descending colon down to the level of the distal descending colon where there is an abrupt cutoff, likely due to the focal bowel wall narrowing as seen on recent CT. IMPRESSION: Mildly dilated colon to the level of the descending colon consistent with large bowel obstruction. Electronically Signed   By: Prudencio Pair M.D.   On: 12/19/2019 03:58   CT Abdomen Pelvis W Contrast  Result Date: 12/18/2019 CLINICAL DATA:  Abdomen distension diarrhea EXAM: CT ABDOMEN AND PELVIS WITH CONTRAST TECHNIQUE: Multidetector CT imaging of  the abdomen and pelvis was performed using the standard protocol following bolus administration of intravenous contrast. CONTRAST:  175mL OMNIPAQUE IOHEXOL 300 MG/ML  SOLN COMPARISON:  None. FINDINGS: Lower chest: Lung bases demonstrate subsegmental atelectasis at the right base. No consolidation or pleural effusion. Cardiac size within normal limits. Hepatobiliary: Subcentimeter hypodensity in the left hepatic lobe too small to characterize. No calcified gallstone or biliary dilatation. Pancreas: Unremarkable. No pancreatic ductal dilatation or surrounding inflammatory changes. Spleen: Normal in size without focal abnormality. Adrenals/Urinary Tract: Mild nodular thickening of the adrenal glands without dominant mass. Multiple subcentimeter hypodense renal lesions too small to further characterize. No hydronephrosis. The bladder is normal Stomach/Bowel: The stomach is nonenlarged. Fluid distended nondilated terminal ileum. Air and fluid distension of the: Measuring up to 9.2 cm. Wall thickening of the descending colon with surrounding inflammatory process. Irregular wall thickening at the sigmoid colon serving as the point of transition. Diffuse diverticular disease of the colon. Negative appendix. Linear peripherally oriented gas collections within the ascending and transverse colon raising concern for intramural air. Negative for portal venous gas. Soft tissue stranding about the right colon as well. Possible small intramural gas and fluid collections within the sigmoid:, for example series 2, image number 77, left-sided 2.5 cm gas and fluid collection. Additional 18 mm gas and fluid collection on the right side, series 2, image number 83. Vascular/Lymphatic: Moderate aortic atherosclerosis. No aneurysm. Visible portions of the SMA and SMV enhance normally. No suspicious adenopathy Reproductive: Prostate is unremarkable. Other: No free air. Small free fluid in the pelvis. Small amount of fluid in the pararenal  spaces. Musculoskeletal: No acute or suspicious osseous abnormality. IMPRESSION: 1. Findings consistent with colon obstruction. Point of transition is at the sigmoid colon where there is irregular masslike narrowing of the bowel with bowel wall thickening. Primary concern is obstructing neoplasm, though inflammatory stricture is also considered given the presence of diverticular disease in the colon. Potential small intramural abscess within the sigmoid colon upstream and downstream to the point of obstruction. Concern raised for intramural air involving  the ascending and transverse colon, given appearance of linear peripherally distributed gas collections. No portal venous gas or free air visible at this time. 2. Mild colon wall thickening/colitis of the descending colon with surrounding inflammatory changes. Pericolonic inflammatory changes involving the right colon as well though without substantial colon wall thickening at this location. 3. Free fluid in the pelvis. Critical Value/emergent results were called by telephone at the time of interpretation on 12/18/2019 at 7:53 pm to provider [facility] , who verbally acknowledged these results. Electronically Signed   By: Jasmine Pang M.D.   On: 12/18/2019 19:54     Scheduled Meds: . enoxaparin (LOVENOX) injection  40 mg Subcutaneous Q24H   Continuous Infusions: . sodium chloride 1,000 mL (12/18/19 2026)  . cefTRIAXone (ROCEPHIN)  IV    . lactated ringers 125 mL/hr at 12/19/19 1053  . metronidazole 500 mg (12/19/19 1320)     LOS: 1 day    Time spent: 25 minutes spent in the coordination of care today.    Teddy Spike, DO [facility]  If 7PM-7AM, please contact night-coverage [email or web address] 12/19/2019, 1:40 PM

## 2019-12-19 NOTE — Progress Notes (Signed)
To OR from PACU after endo

## 2019-12-19 NOTE — Transfer of Care (Signed)
Immediate Anesthesia Transfer of Care Note  Patient: Shawn Reese  Procedure(s) Performed: FLEXIBLE SIGMOIDOSCOPY (N/A ) SUBMUCOSAL TATTOO INJECTION  Patient Location: Endoscopy Unit  Anesthesia Type:General  Level of Consciousness: drowsy and patient cooperative  Airway & Oxygen Therapy: Patient Spontanous Breathing and Patient connected to face mask oxygen  Post-op Assessment: Report given to RN and Post -op Vital signs reviewed and stable  Post vital signs: Reviewed and stable  Last Vitals:  Vitals Value Taken Time  BP 139/97 12/19/19 1510  Temp 36.9 C 12/19/19 1507  Pulse 90 12/19/19 1510  Resp 20 12/19/19 1514  SpO2 98 % 12/19/19 1510  Vitals shown include unvalidated device data.  Last Pain:  Vitals:   12/19/19 1507  TempSrc: Oral  PainSc: 0-No pain      Patients Stated Pain Goal: 2 (12/19/19 9978)  Complications: No apparent anesthesia complications

## 2019-12-19 NOTE — Interval H&P Note (Signed)
History and Physical Interval Note:  12/19/2019 2:17 PM  Shawn Reese  has presented today for surgery, with the diagnosis of Bowel obstruction; Abdominal pain; Diarrhea.  The various methods of treatment have been discussed with the patient and family. After consideration of risks, benefits and other options for treatment, the patient has consented to  Procedure(s): FLEXIBLE SIGMOIDOSCOPY (N/A) as a surgical intervention.  The patient's history has been reviewed, patient examined, no change in status, stable for surgery.  I have reviewed the patient's chart and labs.  Questions were answered to the patient's satisfaction.     [name]

## 2019-12-19 NOTE — Progress Notes (Signed)
Subjective/Chief Complaint: No significant change in symptoms.  Abd xray last night looked like improved colonic distention compared to CT.  No flatus.    Objective: Vital signs in last 24 hours: Temp:  [98.2 F (36.8 C)-99.3 F (37.4 C)] 98.2 F (36.8 C) (05/15 0906) Pulse Rate:  [73-90] 84 (05/15 0906) Resp:  [16-21] 18 (05/15 0906) BP: (123-142)/(81-91) 132/87 (05/15 0906) SpO2:  [92 %-98 %] 94 % (05/15 0906) Weight:  [74.8 kg-75.2 kg] 75.2 kg (05/15 0000) Last BM Date: 12/16/19  Intake/Output from previous day: No intake/output data recorded. Intake/Output this shift: Total I/O In: 1062.4 [I.V.:962.4; IV Piggyback:100] Out: -   General appearance: alert, cooperative and mild distress Resp: breathing comfortably GI: soft, distended, non tender.  no HSM appreciated, but limited by distention. Extremities: extremities normal, atraumatic, no cyanosis or edema  Lab Results:  Recent Labs    12/18/19 1654 12/19/19 0221  WBC 22.3* 19.5*  HGB 15.8 13.7  HCT 45.7 41.6  PLT 346 289   BMET Recent Labs    12/18/19 1654 12/19/19 0221  NA 130* 135  K 3.3* 3.4*  CL 92* 101  CO2 26 26  GLUCOSE 128* 112*  BUN 18 15  CREATININE 0.70 0.72  CALCIUM 8.4* 7.8*   PT/INR Recent Labs    12/19/19 0221  LABPROT 14.0  INR 1.1   ABG No results for input(s): PHART, HCO3 in the last 72 hours.  Invalid input(s): PCO2, PO2  Studies/Results: DG Abd 1 View  Result Date: 12/19/2019 CLINICAL DATA:  Abdominal pain EXAM: ABDOMEN - 1 VIEW COMPARISON:  CT Dec 18, 2019 FINDINGS: Dilated air-filled loops of colon are seen throughout, predominantly the transverse colon measuring up to 7.1 cm. Air-filled loops of nondilated small bowel seen within the mid abdomen. There is air in nondilated descending colon down to the level of the distal descending colon where there is an abrupt cutoff, likely due to the focal bowel wall narrowing as seen on recent CT. IMPRESSION: Mildly dilated colon  to the level of the descending colon consistent with large bowel obstruction. Electronically Signed   By: Jonna Clark M.D.   On: 12/19/2019 03:58   CT Abdomen Pelvis W Contrast  Result Date: 12/18/2019 CLINICAL DATA:  Abdomen distension diarrhea EXAM: CT ABDOMEN AND PELVIS WITH CONTRAST TECHNIQUE: Multidetector CT imaging of the abdomen and pelvis was performed using the standard protocol following bolus administration of intravenous contrast. CONTRAST:  OMNIPAQUE IOHEXOL 300 MG/ML  SOLN COMPARISON:  None. FINDINGS: Lower chest: Lung bases demonstrate subsegmental atelectasis at the right base. No consolidation or pleural effusion. Cardiac size within normal limits. Hepatobiliary: Subcentimeter hypodensity in the left hepatic lobe too small to characterize. No calcified gallstone or biliary dilatation. Pancreas: Unremarkable. No pancreatic ductal dilatation or surrounding inflammatory changes. Spleen: Normal in size without focal abnormality. Adrenals/Urinary Tract: Mild nodular thickening of the adrenal glands without dominant mass. Multiple subcentimeter hypodense renal lesions too small to further characterize. No hydronephrosis. The bladder is normal Stomach/Bowel: The stomach is nonenlarged. Fluid distended nondilated terminal ileum. Air and fluid distension of the: Measuring up to 9.2 cm. Wall thickening of the descending colon with surrounding inflammatory process. Irregular wall thickening at the sigmoid colon serving as the point of transition. Diffuse diverticular disease of the colon. Negative appendix. Linear peripherally oriented gas collections within the ascending and transverse colon raising concern for intramural air. Negative for portal venous gas. Soft tissue stranding about the right colon as well. Possible small intramural gas  and fluid collections within the sigmoid:, for example series 2, image number 77, left-sided 2.5 cm gas and fluid collection. Additional 18 mm gas and fluid  collection on the right side, series 2, image number 83. Vascular/Lymphatic: Moderate aortic atherosclerosis. No aneurysm. Visible portions of the SMA and SMV enhance normally. No suspicious adenopathy Reproductive: Prostate is unremarkable. Other: No free air. Small free fluid in the pelvis. Small amount of fluid in the pararenal spaces. Musculoskeletal: No acute or suspicious osseous abnormality. IMPRESSION: 1. Findings consistent with colon obstruction. Point of transition is at the sigmoid colon where there is irregular masslike narrowing of the bowel with bowel wall thickening. Primary concern is obstructing neoplasm, though inflammatory stricture is also considered given the presence of diverticular disease in the colon. Potential small intramural abscess within the sigmoid colon upstream and downstream to the point of obstruction. Concern raised for intramural air involving the ascending and transverse colon, given appearance of linear peripherally distributed gas collections. No portal venous gas or free air visible at this time. 2. Mild colon wall thickening/colitis of the descending colon with surrounding inflammatory changes. Pericolonic inflammatory changes involving the right colon as well though without substantial colon wall thickening at this location. 3. Free fluid in the pelvis. Critical Value/emergent results were called by telephone at the time of interpretation on 12/18/2019 at 7:53 pm to provider [facility] , who verbally acknowledged these results. Electronically Signed   By: Donavan Foil M.D.   On: 12/18/2019 19:54    Anti-infectives: Anti-infectives (From admission, onward)   Start     Dose/Rate Route Frequency Ordered Stop   12/19/19 2200  cefTRIAXone (ROCEPHIN) 2 g in sodium chloride 0.9 % 100 mL IVPB     2 g 200 mL/hr over 30 Minutes Intravenous Every 24 hours 12/19/19 0102     12/19/19 0600  metroNIDAZOLE (FLAGYL) IVPB 500 mg     500 mg 100 mL/hr over 60 Minutes  Intravenous Every 8 hours 12/19/19 0102     12/18/19 2230  cefTRIAXone (ROCEPHIN) 2 g in sodium chloride 0.9 % 100 mL IVPB     2 g 200 mL/hr over 30 Minutes Intravenous  Once 12/18/19 2216 12/18/19 2306   12/18/19 2230  metroNIDAZOLE (FLAGYL) IVPB 500 mg     500 mg 100 mL/hr over 60 Minutes Intravenous  Once 12/18/19 2216 12/18/19 2357   12/18/19 2000  piperacillin-tazobactam (ZOSYN) IVPB 3.375 g     3.375 g 100 mL/hr over 30 Minutes Intravenous  Once 12/18/19 1958 12/18/19 2110      Assessment/Plan: s/p * No surgery found * Large bowel obstruction and clinical history c/w infection of some type.    Consult GI to discuss consideration of flex sig.  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 Dr. Michail Sermon to assess whether flex sig is possible and if decompression tube could be successful.    No role for NGT as the patient does no have dilated small bowel.    Likely will need surgery in next several days.     LOS: 1 day    Stark Klein 12/19/2019

## 2019-12-20 LAB — CBC WITH DIFFERENTIAL/PLATELET
Abs Immature Granulocytes: 0 10*3/uL (ref 0.00–0.07)
Band Neutrophils: 6 %
Basophils Absolute: 0 10*3/uL (ref 0.0–0.1)
Basophils Relative: 0 %
Eosinophils Absolute: 0 10*3/uL (ref 0.0–0.5)
Eosinophils Relative: 0 %
HCT: 40.8 % (ref 39.0–52.0)
Hemoglobin: 13.2 g/dL (ref 13.0–17.0)
Lymphocytes Relative: 10 %
Lymphs Abs: 1.6 10*3/uL (ref 0.7–4.0)
MCH: 30 pg (ref 26.0–34.0)
MCHC: 32.4 g/dL (ref 30.0–36.0)
MCV: 92.7 fL (ref 80.0–100.0)
Monocytes Absolute: 0.8 10*3/uL (ref 0.1–1.0)
Monocytes Relative: 5 %
Neutro Abs: 13.2 10*3/uL — ABNORMAL HIGH (ref 1.7–7.7)
Neutrophils Relative %: 79 %
Platelets: 318 10*3/uL (ref 150–400)
RBC: 4.4 MIL/uL (ref 4.22–5.81)
RDW: 13.4 % (ref 11.5–15.5)
WBC: 15.5 10*3/uL — ABNORMAL HIGH (ref 4.0–10.5)
nRBC: 0 % (ref 0.0–0.2)

## 2019-12-20 LAB — RENAL FUNCTION PANEL
Albumin: 2.3 g/dL — ABNORMAL LOW (ref 3.5–5.0)
Anion gap: 8 (ref 5–15)
BUN: 14 mg/dL (ref 6–20)
CO2: 29 mmol/L (ref 22–32)
Calcium: 7.4 mg/dL — ABNORMAL LOW (ref 8.9–10.3)
Chloride: 101 mmol/L (ref 98–111)
Creatinine, Ser: 0.61 mg/dL (ref 0.61–1.24)
GFR calc Af Amer: >60 mL/min (ref >60)
GFR calc non Af Amer: >60 mL/min (ref >60)
Glucose, Bld: 148 mg/dL — ABNORMAL HIGH (ref 70–99)
Phosphorus: 3.6 mg/dL (ref 2.5–4.6)
Potassium: 3.8 mmol/L (ref 3.5–5.1)
Sodium: 138 mmol/L (ref 135–145)

## 2019-12-20 LAB — MAGNESIUM: Magnesium: 1.9 mg/dL (ref 1.7–2.4)

## 2019-12-20 LAB — URINE CULTURE: Culture: NO GROWTH

## 2019-12-20 LAB — GLUCOSE, CAPILLARY: Glucose-Capillary: 118 mg/dL — ABNORMAL HIGH (ref 70–99)

## 2019-12-20 NOTE — Progress Notes (Signed)
[facility] Gastroenterology Progress Note  Shawn Reese 59 y.o. [birth year]   Subjective: Sitting in bedside chair feeling ok. Abdominal pain. NGT suctioning bilious fluid.  Objective: Vital signs: Vitals:   12/20/19 0124 12/20/19 0525  BP: 108/77 127/77  Pulse: 76 66  Resp:  18  Temp: 99 F (37.2 C) 98.2 F (36.8 C)  SpO2: 94% 95%    Physical Exam: Gen: lethargic, no acute distress  HEENT: anicteric sclera CV: RRR Chest: CTA B Abd: ostomy in place, diffusely tender with guarding, decreased distention Ext: no edema  Lab Results: Recent Labs    12/19/19 0221 12/20/19 0509  NA 135 138  K 3.4* 3.8  CL 101 101  CO2 26 29  GLUCOSE 112* 148*  BUN 15 14  CREATININE 0.72 0.61  CALCIUM 7.8* 7.4*  MG  --  1.9  PHOS  --  3.6   Recent Labs    12/18/19 1654 12/18/19 1654 12/19/19 0221 12/20/19 0509  AST 16  --  10*  --   ALT 21  --  17  --   ALKPHOS 86  --  70  --   BILITOT 1.0  --  0.8  --   PROT 6.9  --  5.5*  --   ALBUMIN 3.3*   < > 2.6* 2.3*   < > = values in this interval not displayed.   Recent Labs    12/19/19 0221 12/20/19 0509  WBC 19.5* 15.5*  NEUTROABS 16.4* 13.2*  HGB 13.7 13.2  HCT 41.6 40.8  MCV 92.0 92.7  PLT 289 318      Assessment/Plan: Severe colitis seen on flex sig concerning for ischemia - s/p exp lap with end colostomy placed and resection of distal descending and proximal sigmoid colon with surgical findings suggesting an infectious process causing bowel obstruction. Doubt pseudomembranous colitis. Appreciate Dr. Allene Pyo assistance. Shawn Reese GI will f/u tomorrow.   Shawn Reese 12/20/2019, 12:21 PM  Questions please call [phone] ID: Shawn Reese, male   DOB: Jun 29, 1961, 59 y.o.   MRN: [id number]

## 2019-12-20 NOTE — Progress Notes (Signed)
[facility]:  [phone] General Surgery Progress Note   LOS: 2 days  POD -  1 Day Post-Op  Chief Complaint: Colonic obstruction  Assessment and Plan: 1.  EXPLORATORY LAPAROTOMY, BOWEL OBSTRUCTION WITH END COLOSTOMY - 12/19/2019 - Tarrin Menn  Keep NGT and NPO for now.  Has dressing changes for abdominal wound.  Ambulate.  Check stool for C. Diff (so on isolation for now)  2.  DVT prophylaxis - start lovenox   Active Problems:   Colon obstruction (HCC)   Bowel obstruction (HCC)  Subjective:  Feels better today.  Less distended.  Wife in the room.  Objective:   Vitals:   12/20/19 0124 12/20/19 0525  BP: 108/77 127/77  Pulse: 76 66  Resp:  18  Temp: 99 F (37.2 C) 98.2 F (36.8 C)  SpO2: 94% 95%     Intake/Output from previous day:  05/15 0701 - 05/16 0700 In: 6732.3 [I.V.:6102.3; NG/GT:30; IV Piggyback:600] Out: 2300 [Urine:1185; Emesis/NG output:750; Stool:15; Blood:100]  Intake/Output this shift:  Total I/O In: -  Out: 700 [Urine:400; Emesis/NG output:300]   Physical Exam:   General: WN WM who is alert and oriented.    HEENT: Normal. Pupils equal. .   Lungs: Clear   Abdomen: Mildly distended.  Rare BS.   Wound: Mid line wound clean.  Ostomy beat up.   Lab Results:    Recent Labs    12/19/19 0221 12/20/19 0509  WBC 19.5* 15.5*  HGB 13.7 13.2  HCT 41.6 40.8  PLT 289 318    BMET   Recent Labs    12/19/19 0221 12/20/19 0509  NA 135 138  K 3.4* 3.8  CL 101 101  CO2 26 29  GLUCOSE 112* 148*  BUN 15 14  CREATININE 0.72 0.61  CALCIUM 7.8* 7.4*    PT/INR   Recent Labs    12/19/19 0221  LABPROT 14.0  INR 1.1    ABG  No results for input(s): PHART, HCO3 in the last 72 hours.  Invalid input(s): PCO2, PO2   Studies/Results:  DG Abd 1 View  Result Date: 12/19/2019 CLINICAL DATA:  Abdominal pain EXAM: ABDOMEN - 1 VIEW COMPARISON:  CT Dec 18, 2019 FINDINGS: Dilated air-filled loops of colon are seen throughout,  predominantly the transverse colon measuring up to 7.1 cm. Air-filled loops of nondilated small bowel seen within the mid abdomen. There is air in nondilated descending colon down to the level of the distal descending colon where there is an abrupt cutoff, likely due to the focal bowel wall narrowing as seen on recent CT. IMPRESSION: Mildly dilated colon to the level of the descending colon consistent with large bowel obstruction. Electronically Signed   By: Prudencio Pair M.D.   On: 12/19/2019 03:58   CT Abdomen Pelvis W Contrast  Result Date: 12/18/2019 CLINICAL DATA:  Abdomen distension diarrhea EXAM: CT ABDOMEN AND PELVIS WITH CONTRAST TECHNIQUE: Multidetector CT imaging of the abdomen and pelvis was performed using the standard protocol following bolus administration of intravenous contrast. CONTRAST:  133mL OMNIPAQUE IOHEXOL 300 MG/ML  SOLN COMPARISON:  None. FINDINGS: Lower chest: Lung bases demonstrate subsegmental atelectasis at the right base. No consolidation or pleural effusion. Cardiac size within normal limits. Hepatobiliary: Subcentimeter hypodensity in the left hepatic lobe too small to characterize. No calcified gallstone or biliary dilatation. Pancreas: Unremarkable. No pancreatic ductal dilatation or surrounding inflammatory changes. Spleen: Normal in size without focal abnormality. Adrenals/Urinary Tract: Mild nodular thickening of the adrenal glands without dominant mass. Multiple  subcentimeter hypodense renal lesions too small to further characterize. No hydronephrosis. The bladder is normal Stomach/Bowel: The stomach is nonenlarged. Fluid distended nondilated terminal ileum. Air and fluid distension of the: Measuring up to 9.2 cm. Wall thickening of the descending colon with surrounding inflammatory process. Irregular wall thickening at the sigmoid colon serving as the point of transition. Diffuse diverticular disease of the colon. Negative appendix. Linear peripherally oriented gas  collections within the ascending and transverse colon raising concern for intramural air. Negative for portal venous gas. Soft tissue stranding about the right colon as well. Possible small intramural gas and fluid collections within the sigmoid:, for example series 2, image number 77, left-sided 2.5 cm gas and fluid collection. Additional 18 mm gas and fluid collection on the right side, series 2, image number 83. Vascular/Lymphatic: Moderate aortic atherosclerosis. No aneurysm. Visible portions of the SMA and SMV enhance normally. No suspicious adenopathy Reproductive: Prostate is unremarkable. Other: No free air. Small free fluid in the pelvis. Small amount of fluid in the pararenal spaces. Musculoskeletal: No acute or suspicious osseous abnormality. IMPRESSION: 1. Findings consistent with colon obstruction. Point of transition is at the sigmoid colon where there is irregular masslike narrowing of the bowel with bowel wall thickening. Primary concern is obstructing neoplasm, though inflammatory stricture is also considered given the presence of diverticular disease in the colon. Potential small intramural abscess within the sigmoid colon upstream and downstream to the point of obstruction. Concern raised for intramural air involving the ascending and transverse colon, given appearance of linear peripherally distributed gas collections. No portal venous gas or free air visible at this time. 2. Mild colon wall thickening/colitis of the descending colon with surrounding inflammatory changes. Pericolonic inflammatory changes involving the right colon as well though without substantial colon wall thickening at this location. 3. Free fluid in the pelvis. Critical Value/emergent results were called by telephone at the time of interpretation on 12/18/2019 at 7:53 pm to provider [facility] , who verbally acknowledged these results. Electronically Signed   By: Jasmine Pang M.D.   On: 12/18/2019 19:54      Anti-infectives:   Anti-infectives (From admission, onward)   Start     Dose/Rate Route Frequency Ordered Stop   12/19/19 2200  cefTRIAXone (ROCEPHIN) 2 g in sodium chloride 0.9 % 100 mL IVPB     2 g 200 mL/hr over 30 Minutes Intravenous Every 24 hours 12/19/19 0102     12/19/19 0600  metroNIDAZOLE (FLAGYL) IVPB 500 mg     500 mg 100 mL/hr over 60 Minutes Intravenous Every 8 hours 12/19/19 0102     12/18/19 2230  cefTRIAXone (ROCEPHIN) 2 g in sodium chloride 0.9 % 100 mL IVPB     2 g 200 mL/hr over 30 Minutes Intravenous  Once 12/18/19 2216 12/18/19 2306   12/18/19 2230  metroNIDAZOLE (FLAGYL) IVPB 500 mg     500 mg 100 mL/hr over 60 Minutes Intravenous  Once 12/18/19 2216 12/18/19 2357   12/18/19 2000  piperacillin-tazobactam (ZOSYN) IVPB 3.375 g     3.375 g 100 mL/hr over 30 Minutes Intravenous  Once 12/18/19 1958 12/18/19 2110      Ovidio Kin, MD, [facility] Office: [phone] 12/20/2019

## 2019-12-20 NOTE — Progress Notes (Signed)
Shawn Reese  PROGRESS NOTE    Shawn Reese  ZOX:[id number] DOB: 11-10-1960 DOA: 12/18/2019 PCP: Patient, No Pcp Per   Brief Narrative:    Shawn Reese is a 59 y.o. male with no significant past medical history who presents to ed with history of 6 days of n/v/diarrhea that began the day prior to his departure home from vacation in [place]. He notes no other members of his party with similar illness, no other sick contacts or prior episodes of such illness in the past. He notes associated chills and sweats but has note no fever at home. He states the first 3 days he has persistent diarrhea /nausea and dry heaves. He states he believes there may have been some associated bloody stools as well. He notes no presyncope no palpitations. He states his abdominal pain is diffuse and is at a 3 on scale of 0-10. He notes that he is still able to make urine and noted it is dark yellow in color but notes no associated pain. He does states that is diarrhea stopped 3 days ago and he has not had a bowel movement since although he does endorse flatus. He also notes history of colonic polyps on his last c-scope 5 years ago.  5/16: S/p ex lap for bowel obstruction. Now /w end colostomy. Findings were suggestive of infection, so awaiting stool studies and continue abx. Has NGT to suction. Continue. He will remain NPO. Continue D5-1/2NS. Continue IS and get mobile.    Assessment & Plan:   Active Problems:   Colon obstruction (HCC)   Bowel obstruction (HCC)  Infectious Colitis Colon obstruction  Leukocytosis     - rocephin, flagyl     - gen surgery and GI onboard; apprecaite assistance     - GI for flex sig today and possible decompression     - f/u on Bld Cx, GI panel, C diff panel     - bowel rest     - 5/16: He is now s/p ex lab w/ an end colostomy. Flex sig and surgical findings suggestive of infection. Continue abx. Awaiting stool studies. Continue fluids. Remains NPO w/ NGT in place. Appreciate all  consultants assistance.  Hyponatremia  Hypokalemia      - fluids     - replace K+, check Mg2+       - 5/16: K+/Na+ improved; Mg2+ ok. Follow  DVT prophylaxis: lovenox Code Status: FULL Family Communication: With wife at bedside   Status is: Inpatient  Remains inpatient appropriate because:IV treatments appropriate due to intensity of illness or inability to take PO   Dispo: The patient is from: Home              Anticipated d/c is to: Home              Anticipated d/c date is: > 3 days              Patient currently is not medically stable to d/c.  Consultants:   GI  General Surgery  Procedures:   Ex lap  Antimicrobials:  . Rocephin, flagyl   ROS:  Reports ab pain. Denies CP, N, V, dyspnea . Remainder 10-pt ROS is negative for all not previously mentioned.  Subjective: "That's true."  Objective: Vitals:   12/20/19 0124 12/20/19 0500 12/20/19 0525 12/20/19 1308  BP: 108/77  127/77 (!) 142/86  Pulse: 76  66 71  Resp:   18 18  Temp: 99 F (37.2 C)  98.2 F (36.8 C)  99 F (37.2 C)  TempSrc: Oral  Oral Oral  SpO2: 94%  95% 95%  Weight:  78.1 kg    Height:        Intake/Output Summary (Last 24 hours) at 12/20/2019 1509 Last data filed at 12/20/2019 1358 Gross per 24 hour  Intake 4340.79 ml  Output 3500 ml  Net 840.79 ml   Filed Weights   12/18/19 1643 12/19/19 0000 12/20/19 0500  Weight: 74.8 kg 75.2 kg 78.1 kg   Examination:  General: 59 y.o. male resting in bed in NAD Cardiovascular: RRR, +S1, S2, no m/g/r, equal pulses throughout Respiratory: CTABL, no w/r/r, normal WOB GI: BS+, distention improved, surgical dressing CDI, colostomy noted w/ reddish thin outpit, no masses noted, no organomegaly noted, NGT in place MSK: No e/c/c Neuro: A&O x 3, no focal deficits Psyc: Appropriate interaction and affect, calm/cooperative  Data Reviewed: I have personally reviewed following labs and imaging studies.  CBC: Recent Labs  Lab 12/18/19 1654  12/19/19 0221 12/20/19 0509  WBC 22.3* 19.5* 15.5*  NEUTROABS 17.1* 16.4* 13.2*  HGB 15.8 13.7 13.2  HCT 45.7 41.6 40.8  MCV 89.4 92.0 92.7  PLT 346 289 318   Basic Metabolic Panel: Recent Labs  Lab 12/18/19 1654 12/19/19 0221 12/20/19 0509  NA 130* 135 138  K 3.3* 3.4* 3.8  CL 92* 101 101  CO2 26 26 29   GLUCOSE 128* 112* 148*  BUN 18 15 14   CREATININE 0.70 0.72 0.61  CALCIUM 8.4* 7.8* 7.4*  MG  --   --  1.9  PHOS  --   --  3.6   GFR: Estimated Creatinine Clearance: 91.8 mL/min (by C-G formula based on SCr of 0.61 mg/dL). Liver Function Tests: Recent Labs  Lab 12/18/19 1654 12/19/19 0221 12/20/19 0509  AST 16 10*  --   ALT 21 17  --   ALKPHOS 86 70  --   BILITOT 1.0 0.8  --   PROT 6.9 5.5*  --   ALBUMIN 3.3* 2.6* 2.3*   Recent Labs  Lab 12/18/19 1654  LIPASE 20   No results for input(s): AMMONIA in the last 168 hours. Coagulation Profile: Recent Labs  Lab 12/19/19 0221  INR 1.1   Cardiac Enzymes: No results for input(s): CKTOTAL, CKMB, CKMBINDEX, TROPONINI in the last 168 hours. BNP (last 3 results) No results for input(s): PROBNP in the last 8760 hours. HbA1C: No results for input(s): HGBA1C in the last 72 hours. CBG: Recent Labs  Lab 12/19/19 0727 12/20/19 0800  GLUCAP 101* 118*   Lipid Profile: No results for input(s): CHOL, HDL, LDLCALC, TRIG, CHOLHDL, LDLDIRECT in the last 72 hours. Thyroid Function Tests: Recent Labs    12/19/19 0221  TSH 1.193   Anemia Panel: No results for input(s): VITAMINB12, FOLATE, FERRITIN, TIBC, IRON, RETICCTPCT in the last 72 hours. Sepsis Labs: Recent Labs  Lab 12/18/19 1823 12/19/19 0221 12/19/19 0534  LATICACIDVEN 0.9 1.0 1.0    Recent Results (from the past 240 hour(s))  Culture, blood (routine x 2)     Status: None (Preliminary result)   Collection Time: 12/18/19  6:20 PM   Specimen: BLOOD  Result Value Ref Range Status   Specimen Description   Final    BLOOD RIGHT ANTECUBITAL Performed  at [facility], [address] [place], [address] [place]    Special Requests   Final    BOTTLES DRAWN AEROBIC AND ANAEROBIC Blood Culture adequate volume Performed at [facility], [address] [place], [place] [address]    Culture   Final    NO GROWTH 2 DAYS Performed at [facility], [address] [place], [place] [address]    Report Status PENDING  Incomplete  Culture, blood (routine x 2)     Status: None (Preliminary result)   Collection Time: 12/18/19  6:25 PM   Specimen: BLOOD  Result Value Ref Range Status   Specimen Description   Final    BLOOD LEFT ANTECUBITAL Performed at [facility] [place], [address] [place], [place] [address]    Special Requests   Final    BOTTLES DRAWN AEROBIC AND ANAEROBIC Blood Culture adequate volume Performed at [facility] [place], [address] [place], [place] [address]    Culture   Final    NO GROWTH 2 DAYS Performed at [facility], [address] [place], [place] [address]    Report Status PENDING  Incomplete  SARS Coronavirus 2 by RT PCR (hospital order, performed in [facility] hospital lab) Nasopharyngeal Nasopharyngeal Swab     Status: None   Collection Time: 12/18/19  8:27 PM   Specimen: Nasopharyngeal Swab  Result Value Ref Range Status   SARS Coronavirus 2 NEGATIVE NEGATIVE Final    Comment: (NOTE) SARS-CoV-2 target nucleic acids are NOT DETECTED. The SARS-CoV-2 RNA is generally detectable in upper and lower respiratory specimens during the acute phase of infection. The lowest concentration of SARS-CoV-2 viral copies this assay can detect is 250 copies / mL. A negative result does not preclude SARS-CoV-2 infection and should not be used as the sole basis for treatment or other patient management decisions.  A negative result may occur with improper specimen collection / handling, submission of specimen other than nasopharyngeal swab, presence of viral mutation(s) within  the areas targeted by this assay, and inadequate number of viral copies (<250 copies / mL). A negative result must be combined with clinical observations, patient history, and epidemiological information. Fact Sheet for Patients:   BoilerBrush.com.cyhttps://www.fda.gov/media/136312/download Fact Sheet for Healthcare Providers: https://pope.com/https://www.fda.gov/media/136313/download This test is not yet approved or cleared  by the [place] FDA and has been authorized for detection and/or diagnosis of SARS-CoV-2 by FDA under an Emergency Use Authorization (EUA).  This EUA will remain in effect (meaning this test can be used) for the duration of the COVID-19 declaration under Section 564(b)(1) of the Act, 21 U.S.C. section 360bbb-3(b)(1), unless the authorization is terminated or revoked sooner. Performed at [facility] [place], [address] [place], [place] [address]   Urine culture     Status: None   Collection Time: 12/19/19  9:10 AM   Specimen: Urine, Random  Result Value Ref Range Status   Specimen Description   Final    URINE, RANDOM Performed at [facility], [address] [place], [place] [address]    Special Requests   Final    NONE Performed at [facility], [address] [place], [place] [address]    Culture   Final    NO GROWTH Performed at [facility], [address] [place], [place] [address]    Report Status 12/20/2019 FINAL  Final      Radiology Studies: DG Abd 1 View  Result Date: 12/19/2019 CLINICAL DATA:  Abdominal pain EXAM: ABDOMEN - 1 VIEW COMPARISON:  CT Dec 18, 2019 FINDINGS: Dilated air-filled loops of colon are seen throughout, predominantly the transverse colon  measuring up to 7.1 cm. Air-filled loops of nondilated small bowel seen within the mid abdomen. There is air in nondilated descending colon down to the level of the distal descending colon where there is an abrupt cutoff, likely due to the focal bowel wall narrowing  as seen on recent CT. IMPRESSION: Mildly dilated colon to the level of the descending colon consistent with large bowel obstruction. Electronically Signed   By: Jonna Clark M.D.   On: 12/19/2019 03:58   CT Abdomen Pelvis W Contrast  Result Date: 12/18/2019 CLINICAL DATA:  Abdomen distension diarrhea EXAM: CT ABDOMEN AND PELVIS WITH CONTRAST TECHNIQUE: Multidetector CT imaging of the abdomen and pelvis was performed using the standard protocol following bolus administration of intravenous contrast. CONTRAST:  OMNIPAQUE IOHEXOL 300 MG/ML  SOLN COMPARISON:  None. FINDINGS: Lower chest: Lung bases demonstrate subsegmental atelectasis at the right base. No consolidation or pleural effusion. Cardiac size within normal limits. Hepatobiliary: Subcentimeter hypodensity in the left hepatic lobe too small to characterize. No calcified gallstone or biliary dilatation. Pancreas: Unremarkable. No pancreatic ductal dilatation or surrounding inflammatory changes. Spleen: Normal in size without focal abnormality. Adrenals/Urinary Tract: Mild nodular thickening of the adrenal glands without dominant mass. Multiple subcentimeter hypodense renal lesions too small to further characterize. No hydronephrosis. The bladder is normal Stomach/Bowel: The stomach is nonenlarged. Fluid distended nondilated terminal ileum. Air and fluid distension of the: Measuring up to 9.2 cm. Wall thickening of the descending colon with surrounding inflammatory process. Irregular wall thickening at the sigmoid colon serving as the point of transition. Diffuse diverticular disease of the colon. Negative appendix. Linear peripherally oriented gas collections within the ascending and transverse colon raising concern for intramural air. Negative for portal venous gas. Soft tissue stranding about the right colon as well. Possible small intramural gas and fluid collections within the sigmoid:, for example series 2, image number 77, left-sided 2.5 cm gas  and fluid collection. Additional 18 mm gas and fluid collection on the right side, series 2, image number 83. Vascular/Lymphatic: Moderate aortic atherosclerosis. No aneurysm. Visible portions of the SMA and SMV enhance normally. No suspicious adenopathy Reproductive: Prostate is unremarkable. Other: No free air. Small free fluid in the pelvis. Small amount of fluid in the pararenal spaces. Musculoskeletal: No acute or suspicious osseous abnormality. IMPRESSION: 1. Findings consistent with colon obstruction. Point of transition is at the sigmoid colon where there is irregular masslike narrowing of the bowel with bowel wall thickening. Primary concern is obstructing neoplasm, though inflammatory stricture is also considered given the presence of diverticular disease in the colon. Potential small intramural abscess within the sigmoid colon upstream and downstream to the point of obstruction. Concern raised for intramural air involving the ascending and transverse colon, given appearance of linear peripherally distributed gas collections. No portal venous gas or free air visible at this time. 2. Mild colon wall thickening/colitis of the descending colon with surrounding inflammatory changes. Pericolonic inflammatory changes involving the right colon as well though without substantial colon wall thickening at this location. 3. Free fluid in the pelvis. Critical Value/emergent results were called by telephone at the time of interpretation on 12/18/2019 at 7:53 pm to provider [facility] , who verbally acknowledged these results. Electronically Signed   By: Jasmine Pang M.D.   On: 12/18/2019 19:54     Scheduled Meds: . enoxaparin (LOVENOX) injection  40 mg Subcutaneous Q24H   Continuous Infusions: . cefTRIAXone (ROCEPHIN)  IV Stopped (12/19/19 2227)  . dextrose 5 % and 0.45 %  NaCl with KCl 20 mEq/L 125 mL/hr at 12/20/19 0516  . metronidazole 500 mg (12/20/19 1307)     LOS: 2 days    Time spent: 25  minutes spent in the coordination of care today.    Jonnie Finner, DO [facility]  If 7PM-7AM, please contact night-coverage [email or web address] 12/20/2019, 3:09 PM

## 2019-12-20 NOTE — Anesthesia Postprocedure Evaluation (Signed)
Anesthesia Post Note  Patient: Shawn Reese  Procedure(s) Performed: EXPLORATORY LAPAROTOMY, BOWEL OBSTRUCTION WITH END COLOSTOMY (N/A Abdomen)     Patient location during evaluation: PACU Anesthesia Type: General Level of consciousness: awake and alert Pain management: pain level controlled Vital Signs Assessment: post-procedure vital signs reviewed and stable Respiratory status: spontaneous breathing, nonlabored ventilation, respiratory function stable and patient connected to nasal cannula oxygen Cardiovascular status: blood pressure returned to baseline and stable Postop Assessment: no apparent nausea or vomiting Anesthetic complications: no    Last Vitals:  Vitals:   12/20/19 0525 12/20/19 1308  BP: 127/77 (!) 142/86  Pulse: 66 71  Resp: 18 18  Temp: 36.8 C 37.2 C  SpO2: 95% 95%    Last Pain:  Vitals:   12/20/19 1931  TempSrc:   PainSc: 0-No pain                 Ryan P Ellender

## 2019-12-20 NOTE — Progress Notes (Signed)
Patient ambulated up and down hallway. Tolerated ambulation well.

## 2019-12-21 DIAGNOSIS — E876 Hypokalemia: Secondary | ICD-10-CM

## 2019-12-21 DIAGNOSIS — E871 Hypo-osmolality and hyponatremia: Secondary | ICD-10-CM

## 2019-12-21 LAB — CBC WITH DIFFERENTIAL/PLATELET
Abs Immature Granulocytes: 0.31 10*3/uL — ABNORMAL HIGH (ref 0.00–0.07)
Basophils Absolute: 0.1 10*3/uL (ref 0.0–0.1)
Basophils Relative: 1 %
Eosinophils Absolute: 0.1 10*3/uL (ref 0.0–0.5)
Eosinophils Relative: 1 %
HCT: 42.3 % (ref 39.0–52.0)
Hemoglobin: 13.7 g/dL (ref 13.0–17.0)
Immature Granulocytes: 2 %
Lymphocytes Relative: 15 %
Lymphs Abs: 1.9 10*3/uL (ref 0.7–4.0)
MCH: 30.6 pg (ref 26.0–34.0)
MCHC: 32.4 g/dL (ref 30.0–36.0)
MCV: 94.6 fL (ref 80.0–100.0)
Monocytes Absolute: 1.4 10*3/uL — ABNORMAL HIGH (ref 0.1–1.0)
Monocytes Relative: 11 %
Neutro Abs: 9.2 10*3/uL — ABNORMAL HIGH (ref 1.7–7.7)
Neutrophils Relative %: 70 %
Platelets: 330 10*3/uL (ref 150–400)
RBC: 4.47 MIL/uL (ref 4.22–5.81)
RDW: 13.5 % (ref 11.5–15.5)
WBC: 13 10*3/uL — ABNORMAL HIGH (ref 4.0–10.5)
nRBC: 0 % (ref 0.0–0.2)

## 2019-12-21 LAB — BASIC METABOLIC PANEL
Anion gap: 9 (ref 5–15)
BUN: 11 mg/dL (ref 6–20)
CO2: 32 mmol/L (ref 22–32)
Calcium: 7.7 mg/dL — ABNORMAL LOW (ref 8.9–10.3)
Chloride: 100 mmol/L (ref 98–111)
Creatinine, Ser: 0.75 mg/dL (ref 0.61–1.24)
GFR calc Af Amer: >60 mL/min (ref >60)
GFR calc non Af Amer: >60 mL/min (ref >60)
Glucose, Bld: 124 mg/dL — ABNORMAL HIGH (ref 70–99)
Potassium: 3.2 mmol/L — ABNORMAL LOW (ref 3.5–5.1)
Sodium: 141 mmol/L (ref 135–145)

## 2019-12-21 LAB — GLUCOSE, CAPILLARY: Glucose-Capillary: 121 mg/dL — ABNORMAL HIGH (ref 70–99)

## 2019-12-21 NOTE — Progress Notes (Signed)
PROGRESS NOTE    Shawn Reese  QRF:[id number] DOB: 1961-04-14 DOA: 12/18/2019 PCP: Patient, No Pcp Per   Brief Narrative:   [name] a 58 y.o.malewithno significant pastmedical history who presents to ed with history of 6 days of n/v/diarrhea that began the day prior to his departure home from vacation in [place]. He notes no other members of his party with similar illness, no other sick contacts or prior episodes of such illness in the past. He notes associated chills and sweats but has note no fever at home. He states the first 3 days he has persistent diarrhea /nausea and dry heaves. He states he believes there may have been some associated bloody stools as well. He notes no presyncope no palpitations. He states his abdominal pain is diffuse and is at a 3 on scale of 0-10. He notes that he is still able to make urine and noted it is dark yellow in color but notes no associated pain. He does states that is diarrhea stopped 3 days ago and he has not had a bowel movement since although he does endorse flatus. He also notes history of colonic polyps on his last c-scope 5 years ago.  5/16: S/p ex lap for bowel obstruction. Now /w end colostomy. Findings were suggestive of infection, so awaiting stool studies and continue abx. Has NGT to suction. Continue. He will remain NPO. Continue D5-1/2NS. Continue IS and get mobile.   5/17: Continue abx. NGT to clamp today. Starting CLD. Otherwise, continue current Tx.   Assessment & Plan:   Active Problems:   Colon obstruction (HCC)   Bowel obstruction (HCC)  Infectious Colitis Colon obstruction  Leukocytosis - rocephin, flagyl - gen surgery and GI onboard; apprecaite assistance - GI for flex sig today and possible decompression - f/u on Bld Cx, GI panel, C diff panel - bowel rest     - 5/16: He is now s/p ex lab w/ an end colostomy. Flex sig and surgical findings suggestive of infection. Continue  abx. Awaiting stool studies. Continue fluids. Remains NPO w/ NGT in place. Appreciate all consultants assistance.     - 5/17: NGT to clamp today and he is to start CLD. Continue abx.   Hyponatremia  Hypokalemia  Hypocalcemia (mild) - fluids - replace K+, check Mg2+      - 5/16: K+/Na+ improved; Mg2+ ok. Follow     - 5/17: K+ is down today; getting K+ in his fluid now. Follow  DVT prophylaxis: lovneox Code Status: FULL Family Communication: with wife at bedside   Status is: Inpatient  Remains inpatient appropriate because:IV treatments appropriate due to intensity of illness or inability to take PO   Dispo: The patient is from: Home              Anticipated d/c is to: Home              Anticipated d/c date is: 3 days              Patient currently is not medically stable to d/c.  Consultants:   GI  General Surgery  Procedures:   Ex lap w/ end colostomy placement  Antimicrobials:  . Rocephin, flagyl   ROS:  Denies CP, dyspnea, N, V . Remainder 10-pt ROS is negative for all not previously mentioned.  Subjective: "I think I'm doing fine."  Objective: Vitals:   12/20/19 1308 12/20/19 2140 12/21/19 0351 12/21/19 0500  BP: (!) 142/86 132/86 124/77   Pulse: 71 75  63   Resp: 18 18 20    Temp: 99 F (37.2 C) 99.4 F (37.4 C) 98.1 F (36.7 C)   TempSrc: Oral Oral Oral   SpO2: 95% 94% 93%   Weight:    76 kg  Height:        Intake/Output Summary (Last 24 hours) at 12/21/2019 0740 Last data filed at 12/21/2019 12/23/2019 Gross per 24 hour  Intake 411.08 ml  Output 4350 ml  Net -3938.92 ml   Filed Weights   12/19/19 0000 12/20/19 0500 12/21/19 0500  Weight: 75.2 kg 78.1 kg 76 kg    Examination:  General: 59 y.o. male resting in bed in NAD Cardiovascular: RRR, +S1, S2, no m/g/r Respiratory: CTABL, no w/r/r, normal WOB GI: BS+, ND, colostomy noted, TTP at incision site. MSK: No e/c/c Neuro: A&O x 3, no focal deficits Psyc: Appropriate interaction and  affect, calm/cooperative   Data Reviewed: I have personally reviewed following labs and imaging studies.  CBC: Recent Labs  Lab 12/18/19 1654 12/19/19 0221 12/20/19 0509 12/21/19 0340  WBC 22.3* 19.5* 15.5* 13.0*  NEUTROABS 17.1* 16.4* 13.2* 9.2*  HGB 15.8 13.7 13.2 13.7  HCT 45.7 41.6 40.8 42.3  MCV 89.4 92.0 92.7 94.6  PLT 346 289 318 330   Basic Metabolic Panel: Recent Labs  Lab 12/18/19 1654 12/19/19 0221 12/20/19 0509 12/21/19 0340  NA 130* 135 138 141  K 3.3* 3.4* 3.8 3.2*  CL 92* 101 101 100  CO2 26 26 29  32  GLUCOSE 128* 112* 148* 124*  BUN 18 15 14 11   CREATININE 0.70 0.72 0.61 0.75  CALCIUM 8.4* 7.8* 7.4* 7.7*  MG  --   --  1.9  --   PHOS  --   --  3.6  --    GFR: Estimated Creatinine Clearance: 91.8 mL/min (by C-G formula based on SCr of 0.75 mg/dL). Liver Function Tests: Recent Labs  Lab 12/18/19 1654 12/19/19 0221 12/20/19 0509  AST 16 10*  --   ALT 21 17  --   ALKPHOS 86 70  --   BILITOT 1.0 0.8  --   PROT 6.9 5.5*  --   ALBUMIN 3.3* 2.6* 2.3*   Recent Labs  Lab 12/18/19 1654  LIPASE 20   No results for input(s): AMMONIA in the last 168 hours. Coagulation Profile: Recent Labs  Lab 12/19/19 0221  INR 1.1   Cardiac Enzymes: No results for input(s): CKTOTAL, CKMB, CKMBINDEX, TROPONINI in the last 168 hours. BNP (last 3 results) No results for input(s): PROBNP in the last 8760 hours. HbA1C: No results for input(s): HGBA1C in the last 72 hours. CBG: Recent Labs  Lab 12/19/19 0727 12/20/19 0800  GLUCAP 101* 118*   Lipid Profile: No results for input(s): CHOL, HDL, LDLCALC, TRIG, CHOLHDL, LDLDIRECT in the last 72 hours. Thyroid Function Tests: Recent Labs    12/19/19 0221  TSH 1.193   Anemia Panel: No results for input(s): VITAMINB12, FOLATE, FERRITIN, TIBC, IRON, RETICCTPCT in the last 72 hours. Sepsis Labs: Recent Labs  Lab 12/18/19 1823 12/19/19 0221 12/19/19 0534  LATICACIDVEN 0.9 1.0 1.0    Recent Results  (from the past 240 hour(s))  Culture, blood (routine x 2)     Status: None (Preliminary result)   Collection Time: 12/18/19  6:20 PM   Specimen: BLOOD  Result Value Ref Range Status   Specimen Description   Final    BLOOD RIGHT ANTECUBITAL Performed at [facility], [address] [place],  [place] [address]    Special Requests   Final    BOTTLES DRAWN AEROBIC AND ANAEROBIC Blood Culture adequate volume Performed at [facility], [address] [place], [place] [address]    Culture   Final    NO GROWTH 2 DAYS Performed at [facility], [place] [address] [place], [place] [address]    Report Status PENDING  Incomplete  Culture, blood (routine x 2)     Status: None (Preliminary result)   Collection Time: 12/18/19  6:25 PM   Specimen: BLOOD  Result Value Ref Range Status   Specimen Description   Final    BLOOD LEFT ANTECUBITAL Performed at [facility], [address] [place], [place] [address]    Special Requests   Final    BOTTLES DRAWN AEROBIC AND ANAEROBIC Blood Culture adequate volume Performed at [facility], [address] [place], [place] [address]    Culture   Final    NO GROWTH 2 DAYS Performed at [facility], [place] [address] [place], [place] [address]    Report Status PENDING  Incomplete  SARS Coronavirus 2 by RT PCR (hospital order, performed in [facility] hospital lab) Nasopharyngeal Nasopharyngeal Swab     Status: None   Collection Time: 12/18/19  8:27 PM   Specimen: Nasopharyngeal Swab  Result Value Ref Range Status   SARS Coronavirus 2 NEGATIVE NEGATIVE Final    Comment: (NOTE) SARS-CoV-2 target nucleic acids are NOT DETECTED. The SARS-CoV-2 RNA is generally detectable in upper and lower respiratory specimens during the acute phase of infection. The lowest concentration of SARS-CoV-2 viral copies this assay can detect is 250 copies / mL. A negative result does not preclude SARS-CoV-2 infection and  should not be used as the sole basis for treatment or other patient management decisions.  A negative result may occur with improper specimen collection / handling, submission of specimen other than nasopharyngeal swab, presence of viral mutation(s) within the areas targeted by this assay, and inadequate number of viral copies (<250 copies / mL). A negative result must be combined with clinical observations, patient history, and epidemiological information. Fact Sheet for Patients:   StrictlyIdeas.no Fact Sheet for Healthcare Providers: BankingDealers.co.za This test is not yet approved or cleared  by the [place] FDA and has been authorized for detection and/or diagnosis of SARS-CoV-2 by FDA under an Emergency Use Authorization (EUA).  This EUA will remain in effect (meaning this test can be used) for the duration of the COVID-19 declaration under Section 564(b)(1) of the Act, 21 U.S.C. section 360bbb-3(b)(1), unless the authorization is terminated or revoked sooner. Performed at [facility], [address] [place], [place] [address]   Urine culture     Status: None   Collection Time: 12/19/19  9:10 AM   Specimen: Urine, Random  Result Value Ref Range Status   Specimen Description   Final    URINE, RANDOM Performed at [facility], [place] [address] [place], [place] [address]    Special Requests   Final    NONE Performed at [facility], [place] [address], [place], [place] [address]    Culture   Final    NO GROWTH Performed at [facility], [place] [address], [place],  [address]    Report Status 12/20/2019 FINAL  Final      Radiology Studies: No results found.   Scheduled Meds: .  enoxaparin (LOVENOX) injection  40 mg Subcutaneous Q24H   Continuous Infusions: . cefTRIAXone (ROCEPHIN)  IV 2 g (12/20/19 2021)  . dextrose 5 % and 0.45 % NaCl with KCl 20 mEq/L 125  mL/hr at 12/20/19 1621  . metronidazole 500 mg (12/21/19 0520)     LOS: 3 days    Time spent: 25 minutes spent in the coordination of care today.    Teddy Spike, DO [facility]  If 7PM-7AM, please contact night-coverage [email or web address] 12/21/2019, 7:40 AM

## 2019-12-21 NOTE — Progress Notes (Signed)
[facility] Progress Note  2 Days Post-Op  Subjective: Patient reports pain well controlled. Starting to have some stool output. Mobilizing well.   Objective: Vital signs in last 24 hours: Temp:  [98.1 F (36.7 C)-99.4 F (37.4 C)] 98.1 F (36.7 C) (05/17 0351) Pulse Rate:  [63-75] 63 (05/17 0351) Resp:  [18-20] 20 (05/17 0351) BP: (124-142)/(77-86) 124/77 (05/17 0351) SpO2:  [93 %-95 %] 93 % (05/17 0351) Weight:  [76 kg] 76 kg (05/17 0500) Last BM Date: 12/21/19(liquid in colostomy bag)  Intake/Output from previous day: 05/16 0701 - 05/17 0700 In: 461.1 [NG/GT:50; IV Piggyback:411.1] Out: 4350 [Urine:1550; Emesis/NG output:2800] Intake/Output this shift: Total I/O In: -  Out: 375 [Urine:375]  PE: General: pleasant, WD, WN white male who is laying in bed in NAD Heart: regular, rate, and rhythm.  Normal s1,s2. No obvious murmurs, gallops, or rubs noted.  Palpable radial and pedal pulses bilaterally Lungs: CTAB, no wheezes, rhonchi, or rales noted.  Respiratory effort nonlabored Abd: soft, NT, ND, +BS, stoma viable with some liquid stool output     Lab Results:  Recent Labs    12/20/19 0509 12/21/19 0340  WBC 15.5* 13.0*  HGB 13.2 13.7  HCT 40.8 42.3  PLT 318 330   BMET Recent Labs    12/20/19 0509 12/21/19 0340  NA 138 141  K 3.8 3.2*  CL 101 100  CO2 29 32  GLUCOSE 148* 124*  BUN 14 11  CREATININE 0.61 0.75  CALCIUM 7.4* 7.7*   PT/INR Recent Labs    12/19/19 0221  LABPROT 14.0  INR 1.1   CMP     Component Value Date/Time   NA 141 12/21/2019 0340   K 3.2 (L) 12/21/2019 0340   CL 100 12/21/2019 0340   CO2 32 12/21/2019 0340   GLUCOSE 124 (H) 12/21/2019 0340   BUN 11 12/21/2019 0340   CREATININE 0.75 12/21/2019 0340   CALCIUM 7.7 (L) 12/21/2019 0340   PROT 5.5 (L) 12/19/2019 0221   ALBUMIN 2.3 (L) 12/20/2019 0509   AST 10 (L) 12/19/2019 0221   ALT 17 12/19/2019 0221   ALKPHOS 70 12/19/2019 0221   BILITOT 0.8 12/19/2019 0221    GFRNONAA >60 12/21/2019 0340   GFRAA >60 12/21/2019 0340   Lipase     Component Value Date/Time   LIPASE 20 12/18/2019 1654       Studies/Results: No results found.  Anti-infectives: Anti-infectives (From admission, onward)   Start     Dose/Rate Route Frequency Ordered Stop   12/19/19 2200  cefTRIAXone (ROCEPHIN) 2 g in sodium chloride 0.9 % 100 mL IVPB     2 g 200 mL/hr over 30 Minutes Intravenous Every 24 hours 12/19/19 0102     12/19/19 0600  metroNIDAZOLE (FLAGYL) IVPB 500 mg     500 mg 100 mL/hr over 60 Minutes Intravenous Every 8 hours 12/19/19 0102     12/18/19 2230  cefTRIAXone (ROCEPHIN) 2 g in sodium chloride 0.9 % 100 mL IVPB     2 g 200 mL/hr over 30 Minutes Intravenous  Once 12/18/19 2216 12/18/19 2306   12/18/19 2230  metroNIDAZOLE (FLAGYL) IVPB 500 mg     500 mg 100 mL/hr over 60 Minutes Intravenous  Once 12/18/19 2216 12/18/19 2357   12/18/19 2000  piperacillin-tazobactam (ZOSYN) IVPB 3.375 g     3.375 g 100 mL/hr over 30 Minutes Intravenous  Once 12/18/19 1958 12/18/19 2110       Assessment/Plan ?Infectious colitis - ok to hold  off on stool studies for now, WBC trending down and clinically patient is improving, continue abx Colonic obstruction S/p exploratory laparotomy, 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 Lucia Gaskins - POD#2 - clamp NGT and allow clears - can remove NGT this afternoon if tolerating - mobilize - continue dressing changes  FEN: clamp NGT, CLD, decrease IVF to 75 cc/h VTE: SCDs, lovenox ID: zosyn 5/14; rocephin/flagyl 5/14>>  LOS: 3 days    Norm Parcel , [facility] 12/21/2019, 10:20 AM Please see Amion for pager number during day hours 7:00am-4:30pm

## 2019-12-21 NOTE — Progress Notes (Signed)
[facility] Gastroenterology Progress Note  Shawn Reese 59 y.o. 1960/08/31  CC:  Colitis  Subjective: Patient reports feeling better this morning.  Denies nausea and vomiting.  Interested in eating once NGT is removed.  Reports incisional site pain but otherwise denies abdominal pain.  ROS : Review of Systems  Constitutional: Negative for chills and fever.  Respiratory: Negative for cough and shortness of breath.   Cardiovascular: Negative for chest pain and palpitations.  Gastrointestinal: Positive for abdominal pain (mild, incisional). Negative for blood in stool, constipation, diarrhea, heartburn, melena, nausea and vomiting.   Objective: Vital signs in last 24 hours: Vitals:   12/20/19 2140 12/21/19 0351  BP: 132/86 124/77  Pulse: 75 63  Resp: 18 20  Temp: 99.4 F (37.4 C) 98.1 F (36.7 C)  SpO2: 94% 93%    Physical Exam:  General:  Alert, cooperative, sitting in chair in no acute distress, appears stated age  Head:  Normocephalic, without obvious abnormality, atraumatic  Eyes:  Anicteric sclera, EOMs intact  Lungs:   Clear to auscultation bilaterally, respirations unlabored  Heart:  Regular rate and rhythm, S1/S2 normal  Abdomen:   Soft, mildly distended, mild incisional site tenderness, no guarding or peritoneal signs; ostomy bag with green output and some gas   Extremities: Extremities normal, atraumatic, no  edema  Pulses: 2+ and symmetric    Lab Results: Recent Labs    12/20/19 0509 12/21/19 0340  NA 138 141  K 3.8 3.2*  CL 101 100  CO2 29 32  GLUCOSE 148* 124*  BUN 14 11  CREATININE 0.61 0.75  CALCIUM 7.4* 7.7*  MG 1.9  --   PHOS 3.6  --    Recent Labs    12/18/19 1654 12/18/19 1654 12/19/19 0221 12/20/19 0509  AST 16  --  10*  --   ALT 21  --  17  --   ALKPHOS 86  --  70  --   BILITOT 1.0  --  0.8  --   PROT 6.9  --  5.5*  --   ALBUMIN 3.3*   < > 2.6* 2.3*   < > = values in this interval not displayed.   Recent Labs    12/20/19 0509  12/21/19 0340  WBC 15.5* 13.0*  NEUTROABS 13.2* 9.2*  HGB 13.2 13.7  HCT 40.8 42.3  MCV 92.7 94.6  PLT 318 330   Recent Labs    12/19/19 0221  LABPROT 14.0  INR 1.1     Assessment/Plan: Colitis, suspected infectious etiology -Colonoscopy 12/19/19 showed congested, erythematous, inflamed, ulcerated, vascular-pattern-decreased and thickened folds of the mucosa in the sigmoid colon and in the descending colon.  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 -Large bowel obstruction s/p ex lap 12/20/19 with end colostomy with findings concerning for infectious colitis; surgical team following  Plan: -Await pathology results  -Continue empiric antibiotics as per surgery team -Advance diet per surgical recommendations  [facility] will sign off.  Please contact us if we can be of any further assistance during this hospital stay.  Edrick Kins PA-C 12/21/2019, 11:38 AM  Contact #  [phone]

## 2019-12-21 NOTE — Consult Note (Signed)
WOC Nurse ostomy consult note Consult received.  Will see patient on Tuesday, 5/18.  WOC nursing team will follow, and will remain available to this patient, the nursing and medical teams.   Thanks, Ladona Mow, MSN, RN, GNP, [place]  Pager# [phone]

## 2019-12-22 ENCOUNTER — Encounter: Payer: Self-pay | Admitting: *Deleted

## 2019-12-22 LAB — RENAL FUNCTION PANEL
Albumin: 2.5 g/dL — ABNORMAL LOW (ref 3.5–5.0)
Anion gap: 10 (ref 5–15)
BUN: 11 mg/dL (ref 6–20)
CO2: 26 mmol/L (ref 22–32)
Calcium: 7.8 mg/dL — ABNORMAL LOW (ref 8.9–10.3)
Chloride: 101 mmol/L (ref 98–111)
Creatinine, Ser: 0.74 mg/dL (ref 0.61–1.24)
GFR calc Af Amer: >60 mL/min (ref >60)
GFR calc non Af Amer: >60 mL/min (ref >60)
Glucose, Bld: 106 mg/dL — ABNORMAL HIGH (ref 70–99)
Phosphorus: 3 mg/dL (ref 2.5–4.6)
Potassium: 4.1 mmol/L (ref 3.5–5.1)
Sodium: 137 mmol/L (ref 135–145)

## 2019-12-22 LAB — CBC
HCT: 43.1 % (ref 39.0–52.0)
Hemoglobin: 13.8 g/dL (ref 13.0–17.0)
MCH: 30.9 pg (ref 26.0–34.0)
MCHC: 32 g/dL (ref 30.0–36.0)
MCV: 96.4 fL (ref 80.0–100.0)
Platelets: 342 10*3/uL (ref 150–400)
RBC: 4.47 MIL/uL (ref 4.22–5.81)
RDW: 13.6 % (ref 11.5–15.5)
WBC: 12 10*3/uL — ABNORMAL HIGH (ref 4.0–10.5)
nRBC: 0 % (ref 0.0–0.2)

## 2019-12-22 LAB — MAGNESIUM: Magnesium: 1.9 mg/dL (ref 1.7–2.4)

## 2019-12-22 LAB — GLUCOSE, CAPILLARY: Glucose-Capillary: 105 mg/dL — ABNORMAL HIGH (ref 70–99)

## 2019-12-22 MED ORDER — SODIUM CHLORIDE 0.9 % IV SOLN
250.0000 mL | INTRAVENOUS | Status: DC | PRN
  Administered 2019-12-22: 250 mL via INTRAVENOUS

## 2019-12-22 MED ORDER — MORPHINE SULFATE (PF) 2 MG/ML IV SOLN: 2.0000 mg | INTRAVENOUS | Status: DC | PRN

## 2019-12-22 MED ORDER — SODIUM CHLORIDE 0.9% FLUSH
3.0000 mL | Freq: Two times a day (BID) | INTRAVENOUS | Status: DC
  Administered 2019-12-22 – 2019-12-23 (×3): 3 mL via INTRAVENOUS

## 2019-12-22 MED ORDER — OXYCODONE HCL 5 MG PO TABS: 5.0000 mg | ORAL_TABLET | ORAL | Status: DC | PRN

## 2019-12-22 MED ORDER — SODIUM CHLORIDE 0.9% FLUSH: 3.0000 mL | INTRAVENOUS | Status: DC | PRN

## 2019-12-22 MED ORDER — METHOCARBAMOL 500 MG PO TABS: 500.0000 mg | ORAL_TABLET | Freq: Three times a day (TID) | ORAL | Status: DC | PRN

## 2019-12-22 MED ORDER — ACETAMINOPHEN 325 MG PO TABS
650.0000 mg | ORAL_TABLET | Freq: Four times a day (QID) | ORAL | Status: DC
  Administered 2019-12-22 – 2019-12-24 (×9): 650 mg via ORAL
  Filled 2019-12-22 (×9): qty 2

## 2019-12-22 NOTE — Plan of Care (Signed)

## 2019-12-22 NOTE — TOC Progression Note (Signed)
Transition of Care [facility]) - Progression Note    Patient Details  Name: Shawn Reese MRN: [id number] Date of Birth: 1961-01-04  [facility]) CM/SW Contact  Geni Bers, RN Phone Number: 12/22/2019, 12:19 PM  Clinical Narrative:    Pt admitted with Colon obstruction. S/P 5/15 Exploratory Lab/Colosostomy. [facility] was called to make aware that pt was [facility]. Checking on Agency for HHRN/will need HHRN orders please.    Expected Discharge Plan: Home w Home Health Services Barriers to Discharge: No Barriers Identified  Expected Discharge Plan and Services Expected Discharge Plan: Home w Home Health Services   Discharge Planning Services: CM Consult Post Acute Care Choice: Home Health Living arrangements for the past 2 months: Single Family Home                           HH Arranged: RN       Representative spoke with at [facility] Agency: Working on Scientist, forensic   Social Determinants of Health (SDOH) Interventions    Readmission Risk Interventions No flowsheet data found.

## 2019-12-22 NOTE — TOC Progression Note (Signed)
Transition of Care [facility]) - Progression Note    Patient Details  Name: Shawn Reese MRN: [id number] Date of Birth: 1961/05/19  Transition [facility] Care [facility]) CM/SW Contact  Geni Bers, RN Phone Number: 12/22/2019, 12:59 PM  Clinical Narrative:     [facility] returned call. Pt is not [facility]. [facility] Eligibility [phone] ext [phone] or [phone] information given to pt to call to start his eligibility.   Expected Discharge Plan: Home w Home Health Services Barriers to Discharge: No Barriers Identified  Expected Discharge Plan and Services Expected Discharge Plan: Home w Home Health Services   Discharge Planning Services: CM Consult Post Acute Care Choice: Home Health Living arrangements for the past 2 months: Single Family Home                           HH Arranged: RN       Representative spoke with at [facility] Agency: Working on Scientist, forensic   Social Determinants of Health (SDOH) Interventions    Readmission Risk Interventions No flowsheet data found.

## 2019-12-22 NOTE — Progress Notes (Signed)
[facility] Progress Note  3 Days Post-Op  Subjective: Patient reports some soreness with movement but denies abdominal pain at rest. Having stool output. Denies nausea or vomiting, tolerated CLD. WOC to see and work with patient today.   Objective: Vital signs in last 24 hours: Temp:  [98.1 F (36.7 C)-98.6 F (37 C)] 98.5 F (36.9 C) (05/18 0429) Pulse Rate:  [63-77] 63 (05/18 0429) Resp:  [18-20] 20 (05/18 0429) BP: (123-133)/(81-84) 133/81 (05/18 0429) SpO2:  [94 %-96 %] 94 % (05/18 0429) Weight:  [73.4 kg] 73.4 kg (05/18 0500) Last BM Date: 12/21/19(liquid in colostomy bag)  Intake/Output from previous day: 05/17 0701 - 05/18 0700 In: 1961.2 [I.V.:1575; IV Piggyback:384.2] Out: 950 [Urine:700; Stool:250] Intake/Output this shift: No intake/output data recorded.  PE: General: pleasant, WD, WN white male who is sitting in chair in NAD Heart: regular, rate, and rhythm.  Normal s1,s2. No obvious murmurs, gallops, or rubs noted.  Palpable radial and pedal pulses bilaterally Lungs: CTAB, no wheezes, rhonchi, or rales noted.  Respiratory effort nonlabored Abd: soft, NT, ND, +BS, incision c/d/i,  stoma viable with some liquid stool output    Lab Results:  Recent Labs    12/21/19 0340 12/22/19 0357  WBC 13.0* 12.0*  HGB 13.7 13.8  HCT 42.3 43.1  PLT 330 342   BMET Recent Labs    12/21/19 0340 12/22/19 0357  NA 141 137  K 3.2* 4.1  CL 100 101  CO2 32 26  GLUCOSE 124* 106*  BUN 11 11  CREATININE 0.75 0.74  CALCIUM 7.7* 7.8*   PT/INR No results for input(s): LABPROT, INR in the last 72 hours. CMP     Component Value Date/Time   NA 137 12/22/2019 0357   K 4.1 12/22/2019 0357   CL 101 12/22/2019 0357   CO2 26 12/22/2019 0357   GLUCOSE 106 (H) 12/22/2019 0357   BUN 11 12/22/2019 0357   CREATININE 0.74 12/22/2019 0357   CALCIUM 7.8 (L) 12/22/2019 0357   PROT 5.5 (L) 12/19/2019 0221   ALBUMIN 2.5 (L) 12/22/2019 0357   AST 10 (L) 12/19/2019  0221   ALT 17 12/19/2019 0221   ALKPHOS 70 12/19/2019 0221   BILITOT 0.8 12/19/2019 0221   GFRNONAA >60 12/22/2019 0357   GFRAA >60 12/22/2019 0357   Lipase     Component Value Date/Time   LIPASE 20 12/18/2019 1654       Studies/Results: No results found.  Anti-infectives: Anti-infectives (From admission, onward)   Start     Dose/Rate Route Frequency Ordered Stop   12/19/19 2200  cefTRIAXone (ROCEPHIN) 2 g in sodium chloride 0.9 % 100 mL IVPB     2 g 200 mL/hr over 30 Minutes Intravenous Every 24 hours 12/19/19 0102     12/19/19 0600  metroNIDAZOLE (FLAGYL) IVPB 500 mg     500 mg 100 mL/hr over 60 Minutes Intravenous Every 8 hours 12/19/19 0102     12/18/19 2230  cefTRIAXone (ROCEPHIN) 2 g in sodium chloride 0.9 % 100 mL IVPB     2 g 200 mL/hr over 30 Minutes Intravenous  Once 12/18/19 2216 12/18/19 2306   12/18/19 2230  metroNIDAZOLE (FLAGYL) IVPB 500 mg     500 mg 100 mL/hr over 60 Minutes Intravenous  Once 12/18/19 2216 12/18/19 2357   12/18/19 2000  piperacillin-tazobactam (ZOSYN) IVPB 3.375 g     3.375 g 100 mL/hr over 30 Minutes Intravenous  Once 12/18/19 1958 12/18/19 2110  Assessment/Plan ?Infectious colitis - ok to hold off on stool studies for now, WBC trending down and clinically patient is improving, continue abx Colonic obstruction S/p exploratory laparotomy, 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 Ezzard Standing - POD#3 - tolerating CLD - advance to FLD and then soft this evening  - mobilize - dry dressing to wound daily  - surgical path still pending  - may be ready for discharge home in 1-2 days from a surgical standpoint  FEN: FLD, SLIV VTE: SCDs, lovenox ID: zosyn 5/14; rocephin/flagyl 5/14>>  LOS: 4 days    Juliet Rude , [facility] 12/22/2019, 9:48 AM Please see Amion for pager number during day hours 7:00am-4:30pm

## 2019-12-22 NOTE — TOC Progression Note (Signed)
Transition of Care [facility]) - Progression Note    Patient Details  Name: Shawn Reese MRN: [id number] Date of Birth: January 29, 1961  [facility] CM/SW Contact  Geni Bers, RN Phone Number: 12/22/2019, 12:30 PM  Clinical Narrative:    Shawn Reese was selected for [facility]. Referral given to in house rep.    Expected Discharge Plan: Home w Home Health Services Barriers to Discharge: No Barriers Identified  Expected Discharge Plan and Services Expected Discharge Plan: Home w Home Health Services   Discharge Planning Services: CM Consult Post Acute Care Choice: Home Health Living arrangements for the past 2 months: Single Family Home                           HH Arranged: RN       Representative spoke with at [facility] Agency: Working on Scientist, forensic   Social Determinants of Health (SDOH) Interventions    Readmission Risk Interventions No flowsheet data found.

## 2019-12-22 NOTE — Consult Note (Signed)
WOC Nurse ostomy consult note Stoma type/location: LLQ colostomy Stomal assessment/size: 2-inch oval, slightly above skin level.  Dr. Cliffton Asters in to see during visit and there is some peristomal sloughing at stomal edge. Stoma is viable with lumen at center. Peristomal assessment: intact Treatment options for stomal/peristomal skin: skin barrier ring Output: small amount brown stool, flatus Ostomy pouching: 2pc. 2 and 3/4 inch pouching system with skin barrier ring Education provided:  Patient provided with educational booklet and folder. Explained role of ostomy nurse and creation of stoma  Explained stoma characteristics (budded, flush, color, texture, care) Demonstrated pouch change (cutting new skin barrier, measuring stoma, cleaning peristomal skin and stoma, use of barrier ring) Education on emptying when 1/3 to 1/2 full and how to empty Patient has [employer] and this is communicated to [employer] for Supply provider provisions. Answered patient questions.    Enrolled patient in [employer] DC program: Yes, today Supplies in room:  3 pouches, 3 skin barriers and 3 rings. Pattern and measuring guide. My partner will see tomorrow in my absence for continuing instruction.  WOC nursing team will follow, and will remain available to this patient, the nursing and medical teams.   Thanks, Ladona Mow, MSN, RN, GNP, [place]  Pager# [phone]

## 2019-12-22 NOTE — Progress Notes (Signed)
PROGRESS NOTE    Shawn Reese  ZOX:[id number] DOB: 1960/09/23 DOA: 12/18/2019 PCP: Patient, No Pcp Per   Brief Narrative:   [name] a 58 y.o.malewithno significant pastmedical history who presents to ed with history of 6 days of n/v/diarrhea that began the day prior to his departure home from vacation in [place]. He notes no other members of his party with similar illness, no other sick contacts or prior episodes of such illness in the past. He notes associated chills and sweats but has note no fever at home. He states the first 3 days he has persistent diarrhea /nausea and dry heaves. He states he believes there may have been some associated bloody stools as well. He notes no presyncope no palpitations. He states his abdominal pain is diffuse and is at a 3 on scale of 0-10. He notes that he is still able to make urine and noted it is dark yellow in color but notes no associated pain. He does states that is diarrhea stopped 3 days ago and he has not had a bowel movement since although he does endorse flatus. He also notes history of colonic polyps on his last c-scope 5 years ago.  5/16: S/p ex lap for bowel obstruction. Now /w end colostomy. Findings were suggestive of infection, so awaiting stool studies and continue abx. Has NGT to suction. Continue. He will remain NPO. Continue D5-1/2NS. Continue IS and get mobile.  5/17: Continue abx. NGT to clamp today. Starting CLD. Otherwise, continue current Tx.   5/18: NGT is out and he is advancing diet. Continuing IV abx now per surgery. Still not ready for discharge from surgical standpoint. Appreciate their assistance. Continue to mobilize. Path is pending.   Assessment & Plan:   Active Problems:   Colon obstruction (HCC)   Bowel obstruction (HCC)   Hyponatremia   Hypokalemia   Hypocalcemia  Infectious Colitis Colon obstruction  Leukocytosis - rocephin, flagyl - gen surgery and GI onboard;  apprecaite assistance - GI for flex sig today and possible decompression - f/u on Bld Cx, GI panel, C diff panel - bowel rest - 5/16: He is now s/p ex lab w/ an end colostomy. Flex sig and surgical findings suggestive of infection. Continue abx. Awaiting stool studies. Continue fluids. Remains NPO w/ NGT in place. Appreciate all consultants assistance.     - 5/17: NGT to clamp today and he is to start CLD. Continue abx.     - 5/18: NGT removed and he is tolerating CLD. To have diet advanced today. Still has IV abx per surgery. Not quite ready for discharge, but getting closer.    Hyponatremia  Hypokalemia  Hypocalcemia (mild) - 5/18: K+/Mg2+ ok; Corrected Ca2+ is 9.0; follow.  DVT prophylaxis: lovenox Code Status: FULL Family Communication: with wife at bedside   Status is: Inpatient  Remains inpatient appropriate because:IV treatments appropriate due to intensity of illness or inability to take PO   Dispo: The patient is from: Home              Anticipated d/c is to: Home              Anticipated d/c date is: 2 days              Patient currently is not medically stable to d/c.  Consultants:   General surgery  Procedures:   Ex lap w/ end colostomy placement  Antimicrobials:  . Rocephin, flagyl   ROS:  Reports gas. Denies CP, dyspnea,  N, V. Remainder 10-pt ROS is negative for all not previously mentioned.  Subjective: "It's out. It feels better."  Objective: Vitals:   12/21/19 2036 12/22/19 0429 12/22/19 0500 12/22/19 1241  BP: 130/84 133/81  122/83  Pulse: 65 63  71  Resp: 20 20  16   Temp: 98.6 F (37 C) 98.5 F (36.9 C)  98.5 F (36.9 C)  TempSrc: Oral Oral    SpO2: 96% 94%  96%  Weight:   73.4 kg   Height:        Intake/Output Summary (Last 24 hours) at 12/22/2019 1435 Last data filed at 12/22/2019 1344 Gross per 24 hour  Intake 2439.21 ml  Output 225 ml  Net 2214.21 ml   Filed Weights   12/20/19 0500 12/21/19 0500 12/22/19 0500   Weight: 78.1 kg 76 kg 73.4 kg   Examination:  General: 59 y.o. male resting in chair in NAD Cardiovascular: RRR, +S1, S2, no m/g/r, equal pulses throughout Respiratory: CTABL, no w/r/r, normal WOB GI: BS+, NDNT, soft, surgical site bandaging CDI, colostomy w/ some output MSK: No e/c/c Neuro: A&O x 3, no focal deficits Psyc: Appropriate interaction and affect, calm/cooperative  Data Reviewed: I have personally reviewed following labs and imaging studies.  CBC: Recent Labs  Lab 12/18/19 1654 12/19/19 0221 12/20/19 0509 12/21/19 0340 12/22/19 0357  WBC 22.3* 19.5* 15.5* 13.0* 12.0*  NEUTROABS 17.1* 16.4* 13.2* 9.2*  --   HGB 15.8 13.7 13.2 13.7 13.8  HCT 45.7 41.6 40.8 42.3 43.1  MCV 89.4 92.0 92.7 94.6 96.4  PLT 346 289 318 330 342   Basic Metabolic Panel: Recent Labs  Lab 12/18/19 1654 12/19/19 0221 12/20/19 0509 12/21/19 0340 12/22/19 0357  NA 130* 135 138 141 137  K 3.3* 3.4* 3.8 3.2* 4.1  CL 92* 101 101 100 101  CO2 26 26 29  32 26  GLUCOSE 128* 112* 148* 124* 106*  BUN 18 15 14 11 11   CREATININE 0.70 0.72 0.61 0.75 0.74  CALCIUM 8.4* 7.8* 7.4* 7.7* 7.8*  MG  --   --  1.9  --  1.9  PHOS  --   --  3.6  --  3.0   GFR: Estimated Creatinine Clearance: 91.8 mL/min (by C-G formula based on SCr of 0.74 mg/dL). Liver Function Tests: Recent Labs  Lab 12/18/19 1654 12/19/19 0221 12/20/19 0509 12/22/19 0357  AST 16 10*  --   --   ALT 21 17  --   --   ALKPHOS 86 70  --   --   BILITOT 1.0 0.8  --   --   PROT 6.9 5.5*  --   --   ALBUMIN 3.3* 2.6* 2.3* 2.5*   Recent Labs  Lab 12/18/19 1654  LIPASE 20   No results for input(s): AMMONIA in the last 168 hours. Coagulation Profile: Recent Labs  Lab 12/19/19 0221  INR 1.1   Cardiac Enzymes: No results for input(s): CKTOTAL, CKMB, CKMBINDEX, TROPONINI in the last 168 hours. BNP (last 3 results) No results for input(s): PROBNP in the last 8760 hours. HbA1C: No results for input(s): HGBA1C in the last 72  hours. CBG: Recent Labs  Lab 12/19/19 0727 12/20/19 0800 12/21/19 0818 12/22/19 0742  GLUCAP 101* 118* 121* 105*   Lipid Profile: No results for input(s): CHOL, HDL, LDLCALC, TRIG, CHOLHDL, LDLDIRECT in the last 72 hours. Thyroid Function Tests: No results for input(s): TSH, T4TOTAL, FREET4, T3FREE, THYROIDAB in the last 72 hours. Anemia Panel: No results for input(s): VITAMINB12,  FOLATE, FERRITIN, TIBC, IRON, RETICCTPCT in the last 72 hours. Sepsis Labs: Recent Labs  Lab 12/18/19 1823 12/19/19 0221 12/19/19 0534  LATICACIDVEN 0.9 1.0 1.0    Recent Results (from the past 240 hour(s))  Culture, blood (routine x 2)     Status: None (Preliminary result)   Collection Time: 12/18/19  6:20 PM   Specimen: BLOOD  Result Value Ref Range Status   Specimen Description   Final    BLOOD RIGHT ANTECUBITAL Performed at [facility], [address] [place], [place] [address]    Special Requests   Final    BOTTLES DRAWN AEROBIC AND ANAEROBIC Blood Culture adequate volume Performed at [facility], [address], [place], [place] [address]    Culture   Final    NO GROWTH 4 DAYS Performed at [facility], [address] [place], [place] [address]    Report Status PENDING  Incomplete  Culture, blood (routine x 2)     Status: None (Preliminary result)   Collection Time: 12/18/19  6:25 PM   Specimen: BLOOD  Result Value Ref Range Status   Specimen Description   Final    BLOOD LEFT ANTECUBITAL Performed at [facility], [address] [place], [place] [address]    Special Requests   Final    BOTTLES DRAWN AEROBIC AND ANAEROBIC Blood Culture adequate volume Performed at [facility], [address] [place], [place] [address]    Culture   Final    NO GROWTH 4 DAYS Performed at [facility], [address], [place], [place] [address]    Report Status PENDING  Incomplete  SARS Coronavirus 2 by RT PCR (hospital order,  performed in [facility] hospital lab) Nasopharyngeal Nasopharyngeal Swab     Status: None   Collection Time: 12/18/19  8:27 PM   Specimen: Nasopharyngeal Swab  Result Value Ref Range Status   SARS Coronavirus 2 NEGATIVE NEGATIVE Final    Comment: (NOTE) SARS-CoV-2 target nucleic acids are NOT DETECTED. The SARS-CoV-2 RNA is generally detectable in upper and lower respiratory specimens during the acute phase of infection. The lowest concentration of SARS-CoV-2 viral copies this assay can detect is 250 copies / mL. A negative result does not preclude SARS-CoV-2 infection and should not be used as the sole basis for treatment or other patient management decisions.  A negative result may occur with improper specimen collection / handling, submission of specimen other than nasopharyngeal swab, presence of viral mutation(s) within the areas targeted by this assay, and inadequate number of viral copies (<250 copies / mL). A negative result must be combined with clinical observations, patient history, and epidemiological information. Fact Sheet for Patients:   BoilerBrush.com.cy Fact Sheet for Healthcare Providers: https://pope.com/ This test is not yet approved or cleared  by the [place] FDA and has been authorized for detection and/or diagnosis of SARS-CoV-2 by FDA under an Emergency Use Authorization (EUA).  This EUA will remain in effect (meaning this test can be used) for the duration of the COVID-19 declaration under Section 564(b)(1) of the Act, 21 U.S.C. section 360bbb-3(b)(1), unless the authorization is terminated or revoked sooner. Performed at [facility], [address] [place], [place] [address]   Urine culture     Status: None   Collection Time: 12/19/19  9:10 AM   Specimen: Urine, Random  Result Value Ref Range Status   Specimen Description  Final    URINE, RANDOM Performed at [facility], [place] [address], [place], [place] [address]    Special Requests   Final    NONE Performed at [facility], [place] [address], [place], [place] [address]    Culture   Final    NO GROWTH Performed at [facility], [place] [address], [place],  [address]    Report Status 12/20/2019 FINAL  Final      Radiology Studies: No results found.   Scheduled Meds: . acetaminophen  650 mg Oral Q6H  . enoxaparin (LOVENOX) injection  40 mg Subcutaneous Q24H  . sodium chloride flush  3 mL Intravenous Q12H   Continuous Infusions: . sodium chloride    . cefTRIAXone (ROCEPHIN)  IV 2 g (12/21/19 2105)  . metronidazole 500 mg (12/22/19 1402)     LOS: 4 days    Time spent: 25 minutes spent in the coordination of care today.    Jonnie Finner, DO [facility]  If 7PM-7AM, please contact night-coverage [email or web address] 12/22/2019, 2:35 PM

## 2019-12-23 DIAGNOSIS — K5669 Other partial intestinal obstruction: Principal | ICD-10-CM

## 2019-12-23 LAB — GASTROINTESTINAL PANEL BY PCR, STOOL (REPLACES STOOL CULTURE)

## 2019-12-23 LAB — CULTURE, BLOOD (ROUTINE X 2)
Culture: NO GROWTH
Culture: NO GROWTH
Special Requests: ADEQUATE
Special Requests: ADEQUATE

## 2019-12-23 LAB — BASIC METABOLIC PANEL
Anion gap: 11 (ref 5–15)
BUN: 12 mg/dL (ref 6–20)
CO2: 28 mmol/L (ref 22–32)
Calcium: 8.7 mg/dL — ABNORMAL LOW (ref 8.9–10.3)
Chloride: 99 mmol/L (ref 98–111)
Creatinine, Ser: 0.78 mg/dL (ref 0.61–1.24)
GFR calc Af Amer: >60 mL/min (ref >60)
GFR calc non Af Amer: >60 mL/min (ref >60)
Glucose, Bld: 106 mg/dL — ABNORMAL HIGH (ref 70–99)
Potassium: 4.1 mmol/L (ref 3.5–5.1)
Sodium: 138 mmol/L (ref 135–145)

## 2019-12-23 LAB — CBC
HCT: 47 % (ref 39.0–52.0)
Hemoglobin: 15.2 g/dL (ref 13.0–17.0)
MCH: 30.3 pg (ref 26.0–34.0)
MCHC: 32.3 g/dL (ref 30.0–36.0)
MCV: 93.6 fL (ref 80.0–100.0)
Platelets: 400 10*3/uL (ref 150–400)
RBC: 5.02 MIL/uL (ref 4.22–5.81)
RDW: 13.3 % (ref 11.5–15.5)
WBC: 15.7 10*3/uL — ABNORMAL HIGH (ref 4.0–10.5)
nRBC: 0 % (ref 0.0–0.2)

## 2019-12-23 LAB — GLUCOSE, CAPILLARY: Glucose-Capillary: 94 mg/dL (ref 70–99)

## 2019-12-23 LAB — SURGICAL PATHOLOGY

## 2019-12-23 NOTE — Progress Notes (Signed)
[facility] Progress Note  4 Days Post-Op  Subjective: Patient had one episode emesis overnight but has felt fine since. Still having output from colostomy. Worked with WOC RN yesterday. He is ambulating well. Denies urinary symptoms or cough.   Objective: Vital signs in last 24 hours: Temp:  [98 F (36.7 C)-98.9 F (37.2 C)] 98 F (36.7 C) (05/19 0503) Pulse Rate:  [71-80] 80 (05/19 0503) Resp:  [16-18] 18 (05/19 0503) BP: (122-134)/(83-95) 134/85 (05/19 0503) SpO2:  [93 %-96 %] 96 % (05/19 0503) Last BM Date: 12/22/19(colostomy bag)  Intake/Output from previous day: 05/18 0701 - 05/19 0700 In: 1659.1 [P.O.:1200; I.V.:59.1; IV Piggyback:400] Out: -  Intake/Output this shift: Total I/O In: 240 [P.O.:240] Out: -   PE: General: pleasant, WD, WN white male who is sitting in chair in NAD Heart: regular, rate, and rhythm. Normal s1,s2. No obvious murmurs, gallops, or rubs noted. Palpable radial and pedal pulses bilaterally Lungs: CTAB, no wheezes, rhonchi, or rales noted. Respiratory effort nonlabored Abd: soft, NT, ND, +BS, incision c/d/i, stoma viable with some liquid stool output   Lab Results:  Recent Labs    12/22/19 0357 12/23/19 0334  WBC 12.0* 15.7*  HGB 13.8 15.2  HCT 43.1 47.0  PLT 342 400   BMET Recent Labs    12/22/19 0357 12/23/19 0334  NA 137 138  K 4.1 4.1  CL 101 99  CO2 26 28  GLUCOSE 106* 106*  BUN 11 12  CREATININE 0.74 0.78  CALCIUM 7.8* 8.7*   PT/INR No results for input(s): LABPROT, INR in the last 72 hours. CMP     Component Value Date/Time   NA 138 12/23/2019 0334   K 4.1 12/23/2019 0334   CL 99 12/23/2019 0334   CO2 28 12/23/2019 0334   GLUCOSE 106 (H) 12/23/2019 0334   BUN 12 12/23/2019 0334   CREATININE 0.78 12/23/2019 0334   CALCIUM 8.7 (L) 12/23/2019 0334   PROT 5.5 (L) 12/19/2019 0221   ALBUMIN 2.5 (L) 12/22/2019 0357   AST 10 (L) 12/19/2019 0221   ALT 17 12/19/2019 0221   ALKPHOS 70 12/19/2019 0221    BILITOT 0.8 12/19/2019 0221   GFRNONAA >60 12/23/2019 0334   GFRAA >60 12/23/2019 0334   Lipase     Component Value Date/Time   LIPASE 20 12/18/2019 1654       Studies/Results: No results found.  Anti-infectives: Anti-infectives (From admission, onward)   Start     Dose/Rate Route Frequency Ordered Stop   12/19/19 2200  cefTRIAXone (ROCEPHIN) 2 g in sodium chloride 0.9 % 100 mL IVPB     2 g 200 mL/hr over 30 Minutes Intravenous Every 24 hours 12/19/19 0102     12/19/19 0600  metroNIDAZOLE (FLAGYL) IVPB 500 mg     500 mg 100 mL/hr over 60 Minutes Intravenous Every 8 hours 12/19/19 0102     12/18/19 2230  cefTRIAXone (ROCEPHIN) 2 g in sodium chloride 0.9 % 100 mL IVPB     2 g 200 mL/hr over 30 Minutes Intravenous  Once 12/18/19 2216 12/18/19 2306   12/18/19 2230  metroNIDAZOLE (FLAGYL) IVPB 500 mg     500 mg 100 mL/hr over 60 Minutes Intravenous  Once 12/18/19 2216 12/18/19 2357   12/18/19 2000  piperacillin-tazobactam (ZOSYN) IVPB 3.375 g     3.375 g 100 mL/hr over 30 Minutes Intravenous  Once 12/18/19 1958 12/18/19 2110       Assessment/Plan ?Infectious colitis- WBC up slightly today, pt afeb,  send GI panel  Colonic obstruction S/p exploratory laparotomy, 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 Lucia Gaskins - POD#4 - pt had one episode emesis overnight but no nausea currently and still having stoma output - continue soft diet  - mobilize - dry dressing to wound daily  - surgical path still pending - called pathology, hopefully will be back this afternoon  - may be ready for discharge home in 1-2 days from a surgical standpoint  FEN: soft diet  VTE: SCDs, lovenox ID: zosyn 5/14; rocephin/flagyl 5/14>>  LOS: 5 days    Norm Parcel , [facility] 12/23/2019, 9:48 AM Please see Amion for pager number during day hours 7:00am-4:30pm

## 2019-12-23 NOTE — Consult Note (Signed)
WOC Nurse ostomy follow up Stoma type/location: LLQ, colostomy Treatment options for stomal/peristomal skin: skin barrier ring with pouch change yesterday Output liquid brown Ostomy pouching: 2pc.  Education provided:  Answered questions related to emptying, patient verbalized he is independent. No other questions. CCS in to room to assess Enrolled patient in [place] Secure Start Discharge program: Yes  WOC Nurse will follow along with you for continued support with ostomy teaching and care Isatou Agredano [facility] MSN, RN, [place], CNS, [place] [phone]

## 2019-12-23 NOTE — Progress Notes (Signed)
PROGRESS NOTE    Shawn Reese  DGL:[id number] DOB: May 22, 1961 DOA: 12/18/2019 PCP: Patient, No Pcp Per    Brief Narrative:  Patient was admitted to the hospital with a working diagnosis of severe colitis/ischemic, complicated by colonic obstruction.  Now s/p exploratory laparotomy, partial colectomy with end colostomy 42/62.  59 year old male who presented with diarrhea, nausea and dry heaves.  Recent travel to [place]  Possible bloody stools.  On his initial physical examination blood pressure 142/91, heart rate 73, respirate 18, temperature 98.3, oxygen saturation 96%.  His lungs were clear to auscultation bilaterally, heart S1-S2 present rhythmic, his abdomen was diffusely tender, distended, positive bowel sounds.  Positive guarding but no rebound.  No lower extremity edema. CT of the abdomen with signs of colonic obstruction.  Transition point at the sigmoid colon, irregular masslike narrowing the bowel lumen.  Patient underwent flexible sigmoidoscopy, showing congested, erythematous, inflamed, ulcerated vascular pattern and thickened folds of mucosa in the sigmoid colon and descending colon.  Patient was placed on antibiotic therapy with ceftriaxone and metronidazole.  May 15 patient underwent expiratory laparotomy with partial colectomy with end colostomy.    Assessment & Plan:   Principal Problem:   Colon obstruction ([place]) Active Problems:   Bowel obstruction (HCC)   Hyponatremia   Hypokalemia   Hypocalcemia   1. Acute colonic obstruction. Pathology with diverticulitis and ulcerated lesions. Patient is now sp partial colectomy and colostomy. Today with no nausea or vomiting.  Continue antibiotic therapy with metronidazole and ceftriaxone. Will continue to follow cell count.   2. Hyponatremia, hypocalcemia and hypokalemia. Improved electrolytes with stable renal function.   Patient tolerating po well.   Status is: Inpatient  Remains inpatient  appropriate because:Ongoing active pain requiring inpatient pain management   Dispo: The patient is from: Home              Anticipated d/c is to: Home              Anticipated d/c date is: 1 day              Patient currently is not medically stable to d/c.   DVT prophylaxis: Enoxaparin   Code Status:   full  Family Communication:  No family at the bedside      Consultants:   Surgery   Procedures:   Partial colectomy with colostomy   Antimicrobials:   Ceftriaxone and metronidazole.     Subjective: Patient with no further nausea or vomiting, he is tolerating soft diet, out of bed to chair.   Objective: Vitals:   12/22/19 1241 12/22/19 2205 12/23/19 0503 12/23/19 1240  BP: 122/83 (!) 130/95 134/85 122/78  Pulse: 71 76 80 70  Resp: 16  18 18   Temp: 98.5 F (36.9 C) 98.9 F (37.2 C) 98 F (36.7 C) 99.6 F (37.6 C)  TempSrc:  Oral Oral Oral  SpO2: 96% 93% 96% 96%  Weight:      Height:        Intake/Output Summary (Last 24 hours) at 12/23/2019 1504 Last data filed at 12/23/2019 0900 Gross per 24 hour  Intake 1319.13 ml  Output --  Net 1319.13 ml   Filed Weights   12/20/19 0500 12/21/19 0500 12/22/19 0500  Weight: 78.1 kg 76 kg 73.4 kg    Examination:   General: Not in pain or dyspnea. Deconditioned  Neurology: Awake and alert, non focal  E ENT: no pallor, no icterus, oral mucosa moist Cardiovascular: No JVD. S1-S2 present, rhythmic,  no gallops, rubs, or murmurs. No lower extremity edema. Pulmonary: vesicular breath sounds bilaterally, adequate air movement, no wheezing, rhonchi or rales. Gastrointestinal. Abdomen sift with no organomegaly, non tender, no rebound or guarding. Mil distended, surgical wound with dressing in place, colostomy bag in place.  Skin. No rashes Musculoskeletal: no joint deformities     Data Reviewed: I have personally reviewed following labs and imaging studies  CBC: Recent Labs  Lab 12/18/19 1654 12/18/19 1654  12/19/19 0221 12/20/19 0509 12/21/19 0340 12/22/19 0357 12/23/19 0334  WBC 22.3*   < > 19.5* 15.5* 13.0* 12.0* 15.7*  NEUTROABS 17.1*  --  16.4* 13.2* 9.2*  --   --   HGB 15.8   < > 13.7 13.2 13.7 13.8 15.2  HCT 45.7   < > 41.6 40.8 42.3 43.1 47.0  MCV 89.4   < > 92.0 92.7 94.6 96.4 93.6  PLT 346   < > 289 318 330 342 400   < > = values in this interval not displayed.   Basic Metabolic Panel: Recent Labs  Lab 12/19/19 0221 12/20/19 0509 12/21/19 0340 12/22/19 0357 12/23/19 0334  NA 135 138 141 137 138  K 3.4* 3.8 3.2* 4.1 4.1  CL 101 101 100 101 99  CO2 26 29 32 26 28  GLUCOSE 112* 148* 124* 106* 106*  BUN 15 14 11 11 12   CREATININE 0.72 0.61 0.75 0.74 0.78  CALCIUM 7.8* 7.4* 7.7* 7.8* 8.7*  MG  --  1.9  --  1.9  --   PHOS  --  3.6  --  3.0  --    GFR: Estimated Creatinine Clearance: 91.8 mL/min (by C-G formula based on SCr of 0.78 mg/dL). Liver Function Tests: Recent Labs  Lab 12/18/19 1654 12/19/19 0221 12/20/19 0509 12/22/19 0357  AST 16 10*  --   --   ALT 21 17  --   --   ALKPHOS 86 70  --   --   BILITOT 1.0 0.8  --   --   PROT 6.9 5.5*  --   --   ALBUMIN 3.3* 2.6* 2.3* 2.5*   Recent Labs  Lab 12/18/19 1654  LIPASE 20   No results for input(s): AMMONIA in the last 168 hours. Coagulation Profile: Recent Labs  Lab 12/19/19 0221  INR 1.1   Cardiac Enzymes: No results for input(s): CKTOTAL, CKMB, CKMBINDEX, TROPONINI in the last 168 hours. BNP (last 3 results) No results for input(s): PROBNP in the last 8760 hours. HbA1C: No results for input(s): HGBA1C in the last 72 hours. CBG: Recent Labs  Lab 12/19/19 0727 12/20/19 0800 12/21/19 0818 12/22/19 0742 12/23/19 0738  GLUCAP 101* 118* 121* 105* 94   Lipid Profile: No results for input(s): CHOL, HDL, LDLCALC, TRIG, CHOLHDL, LDLDIRECT in the last 72 hours. Thyroid Function Tests: No results for input(s): TSH, T4TOTAL, FREET4, T3FREE, THYROIDAB in the last 72 hours. Anemia Panel: No results  for input(s): VITAMINB12, FOLATE, FERRITIN, TIBC, IRON, RETICCTPCT in the last 72 hours.    Radiology Studies: I have reviewed all of the imaging during this hospital visit personally     Scheduled Meds: . acetaminophen  650 mg Oral Q6H  . enoxaparin (LOVENOX) injection  40 mg Subcutaneous Q24H  . sodium chloride flush  3 mL Intravenous Q12H   Continuous Infusions: . sodium chloride 10 mL/hr at 12/23/19 0524  . cefTRIAXone (ROCEPHIN)  IV Stopped (12/23/19 0005)  . metronidazole 500 mg (12/23/19 1244)     LOS:  5 days        Kamilla Hands Gerome Apley, MD

## 2019-12-24 LAB — GLUCOSE, CAPILLARY: Glucose-Capillary: 114 mg/dL — ABNORMAL HIGH (ref 70–99)

## 2019-12-24 LAB — BASIC METABOLIC PANEL
Anion gap: 8 (ref 5–15)
BUN: 11 mg/dL (ref 6–20)
CO2: 25 mmol/L (ref 22–32)
Calcium: 8.1 mg/dL — ABNORMAL LOW (ref 8.9–10.3)
Chloride: 100 mmol/L (ref 98–111)
Creatinine, Ser: 0.69 mg/dL (ref 0.61–1.24)
GFR calc Af Amer: >60 mL/min (ref >60)
GFR calc non Af Amer: >60 mL/min (ref >60)
Glucose, Bld: 95 mg/dL (ref 70–99)
Potassium: 3.8 mmol/L (ref 3.5–5.1)
Sodium: 133 mmol/L — ABNORMAL LOW (ref 135–145)

## 2019-12-24 LAB — CBC
HCT: 42 % (ref 39.0–52.0)
Hemoglobin: 13.9 g/dL (ref 13.0–17.0)
MCH: 30.7 pg (ref 26.0–34.0)
MCHC: 33.1 g/dL (ref 30.0–36.0)
MCV: 92.7 fL (ref 80.0–100.0)
Platelets: 375 10*3/uL (ref 150–400)
RBC: 4.53 MIL/uL (ref 4.22–5.81)
RDW: 13.3 % (ref 11.5–15.5)
WBC: 14 10*3/uL — ABNORMAL HIGH (ref 4.0–10.5)
nRBC: 0 % (ref 0.0–0.2)

## 2019-12-24 MED ORDER — AMOXICILLIN-POT CLAVULANATE 875-125 MG PO TABS: 1.0000 | ORAL_TABLET | Freq: Two times a day (BID) | ORAL | 0 refills | Status: AC

## 2019-12-24 MED ORDER — ACETAMINOPHEN 325 MG PO TABS: 650.0000 mg | ORAL_TABLET | Freq: Four times a day (QID) | ORAL | 0 refills | Status: AC

## 2019-12-24 MED ORDER — AMOXICILLIN-POT CLAVULANATE 875-125 MG PO TABS: 1.0000 | ORAL_TABLET | Freq: Two times a day (BID) | ORAL | Status: DC

## 2019-12-24 NOTE — Consult Note (Signed)
WOC Nurse ostomy consult note Stoma type/location: LUQ colosotmy Stomal assessment/size: oval, approximately 1 and 3/4 inches Peristomal assessment: inact Treatment options for stomal/peristomal skin: skin barrier ring Output: brown liquid effluent Ostomy pouching: 2pc. 2 and 3/4 inch pouching system with skin barrier ring Education provided: Patient prepared skin barrier independently with coaching and changed pouching system with assistance [facility] nurse in centering and applying. Wife in to visit at end of pouch change. Review of support for questions, supplies, taking a spare pouching system wherever he goes, etc. Review of how reconnection surgery typically goes for planning purposes. Will have HHRN post discharge to reinforce teaching and support patient as stoma changes size and shape. Enrolled patient in [place] Secure Start DC program: Yes Supplies provided for discharge: 4 rings, 4 skin barriers and 4 pouches.  WOC nursing team will follow, and will remain available to this patient, the nursing and medical teams.  Thanks, Ladona Mow, MSN, RN, GNP, [place]  Pager# [phone]

## 2019-12-24 NOTE — Progress Notes (Signed)
WOC paged for Ostomy teaching?

## 2019-12-24 NOTE — Progress Notes (Signed)
[facility] Progress Note  5 Days Post-Op  Subjective: No further emesis, feeling quite well. Still having output from colostomy. Pain well controlled.   Objective: Vital signs in last 24 hours: Temp:  [98.3 F (36.8 C)-99.6 F (37.6 C)] 98.7 F (37.1 C) (05/20 0522) Pulse Rate:  [66-70] 66 (05/20 0522) Resp:  [18] 18 (05/20 0522) BP: (120-126)/(77-82) 120/82 (05/20 0522) SpO2:  [95 %-96 %] 96 % (05/20 0522) Last BM Date: 12/23/19  Intake/Output from previous day: 05/19 0701 - 05/20 0700 In: 440 [P.O.:240; IV Piggyback:200] Out: -  Intake/Output this shift: Total I/O In: 240 [P.O.:240] Out: -   PE: General: pleasant and in good spirits Heart: rrr Abd: soft, NT, ND, +BS, incision c/d/i, stoma viable with some liquid stool output   Lab Results:  Recent Labs    12/23/19 0334 12/24/19 0440  WBC 15.7* 14.0*  HGB 15.2 13.9  HCT 47.0 42.0  PLT 400 375   BMET Recent Labs    12/23/19 0334 12/24/19 0440  NA 138 133*  K 4.1 3.8  CL 99 100  CO2 28 25  GLUCOSE 106* 95  BUN 12 11  CREATININE 0.78 0.69  CALCIUM 8.7* 8.1*   PT/INR No results for input(s): LABPROT, INR in the last 72 hours. CMP     Component Value Date/Time   NA 133 (L) 12/24/2019 0440   K 3.8 12/24/2019 0440   CL 100 12/24/2019 0440   CO2 25 12/24/2019 0440   GLUCOSE 95 12/24/2019 0440   BUN 11 12/24/2019 0440   CREATININE 0.69 12/24/2019 0440   CALCIUM 8.1 (L) 12/24/2019 0440   PROT 5.5 (L) 12/19/2019 0221   ALBUMIN 2.5 (L) 12/22/2019 0357   AST 10 (L) 12/19/2019 0221   ALT 17 12/19/2019 0221   ALKPHOS 70 12/19/2019 0221   BILITOT 0.8 12/19/2019 0221   GFRNONAA >60 12/24/2019 0440   GFRAA >60 12/24/2019 0440   Lipase     Component Value Date/Time   LIPASE 20 12/18/2019 1654       Studies/Results: No results found.  Anti-infectives: Anti-infectives (From admission, onward)   Start     Dose/Rate Route Frequency Ordered Stop   12/19/19 2200  cefTRIAXone  (ROCEPHIN) 2 g in sodium chloride 0.9 % 100 mL IVPB     2 g 200 mL/hr over 30 Minutes Intravenous Every 24 hours 12/19/19 0102     12/19/19 0600  metroNIDAZOLE (FLAGYL) IVPB 500 mg     500 mg 100 mL/hr over 60 Minutes Intravenous Every 8 hours 12/19/19 0102     12/18/19 2230  cefTRIAXone (ROCEPHIN) 2 g in sodium chloride 0.9 % 100 mL IVPB     2 g 200 mL/hr over 30 Minutes Intravenous  Once 12/18/19 2216 12/18/19 2306   12/18/19 2230  metroNIDAZOLE (FLAGYL) IVPB 500 mg     500 mg 100 mL/hr over 60 Minutes Intravenous  Once 12/18/19 2216 12/18/19 2357   12/18/19 2000  piperacillin-tazobactam (ZOSYN) IVPB 3.375 g     3.375 g 100 mL/hr over 30 Minutes Intravenous  Once 12/18/19 1958 12/18/19 2110       Assessment/Plan ?Infectious colitis- WBC up slightly today, pt afeb, send GI panel  Colonic obstruction S/p exploratory laparotomy, 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 Lucia Gaskins - POD#5 - Diet as tolerated - mobilize - dry dressing to wound daily  - pathology showed diverticulitis with some ulcers of unclear significance but GI pathogen panel negative.   - Has [facility] visit planned  today - he is ok for discharge from our standpoint assuming he feels comfortable - he wanted to meet with her first and ensure all is smooth for transition home  FEN: soft diet  VTE: SCDs, lovenox   LOS: 6 days   Marin Olp, M.D. [facility]

## 2019-12-24 NOTE — Discharge Instructions (Signed)
Colostomy Surgery, Adult, Care After  This sheet gives you information about how to care for yourself after your procedure. Your health care provider may also give you more specific instructions. If you have problems or questions, contact your health care provider. What can I expect after the procedure? After the procedure, it is common to have:  Swelling at the opening that was created during the procedure (stoma).  Slight bleeding around the stoma.  Redness around the stoma. Follow these instructions at home: Activity  Rest as needed while the stoma area heals.  Return to your normal activities as told by your health care provider. Ask your health care provider what activities are safe for you.  Avoid strenuous activity and abdominal exercises for 3 weeks or for as long as told by your health care provider.  Do not lift anything that is heavier than 10 lb (4.5 kg), or the limit that you are told, until your health care provider says that it is safe. Incision care  Follow instructions from your health care provider about how to take care of your incision. Make sure you: ? Wash your hands with soap and water before you change your bandage (dressing). If soap and water are not available, use hand sanitizer. ? Change your dressing as told by your health care provider. ? Leave stitches (sutures), skin glue, or adhesive strips in place. These skin closures may need to stay in place for 2 weeks or longer. If adhesive strip edges start to loosen and curl up, you may trim the loose edges. Do not remove adhesive strips completely unless your health care provider tells you to do that. Stoma care  Keep the stoma area clean.  Clean and dry the skin around the stoma each time you change the colostomy bag. To clean the stoma area: ? Use warm water and only use cleansers that are recommended by your health care provider. ? Rinse the stoma area with plain water. ? Dry the area well.  Use stoma  powder or skin barrier film on your skin only as told by your health care provider. Do not use any other powders, gels, wipes, or creams on the skin around the stoma.  Check the stoma area every day for signs of infection. Check for: ? More redness, swelling, or pain. ? More fluid or blood. ? Pus or warmth.  Measure the stoma opening regularly and record the size. Watch for changes. Share this information with your health care provider. Bathing  Do not take baths, swim, or use a hot tub until your health care provider approves. Ask your health care provider if you may take showers. You may be able to shower with or without the colostomy bag in place. If you bathe with the bag on, dry the bag afterward.  Avoid using harsh or oily soaps when you bathe. Colostomy bag care  Follow instructions from your health care provider about how to empty or change the colostomy bag.  Keep colostomy supplies with you at all times.  Store all supplies in a cool, dry place.  Empty the colostomy bag: ? Whenever it is one-third to one-half full. ? At bedtime.  Replace the bag every 3-4 days for the first 6 weeks, then every 4-7 days. Driving  Follow driving restrictions as told by your health care provider.  Do not drive or use heavy machinery while taking prescription pain medicine. General instructions  Follow instructions from your health care provider about eating or drinking restrictions.  Take over-the-counter and prescription medicines only as told by your health care provider.  Avoid wearing clothes that are tight directly over your stoma area.  Do not use any products that contain nicotine or tobacco, such as cigarettes and e-cigarettes. If you need help quitting, ask your health care provider.  If you are a woman, ask your health care provider about becoming pregnant and about using birth control. Medicines may not be absorbed normally after the procedure.  Keep all follow-up visits  as told by your health care provider. This is important. Contact a health care provider if you have:  Trouble caring for your stoma or changing the colostomy bag.  Nausea or vomiting.  A fever.  More redness, swelling, or pain at the site of your stoma or around your anus.  More fluid or blood coming from your stoma or your anus.  Warmth around your stoma area.  Pus coming from your stoma.  A change in the size or appearance of the stoma.  Abdominal pain, bloating, pressure, or cramping.  Stool more often or less often than your health care provider tells you to expect.  Very little urine production. This may be a sign of dehydration. Get help right away if you have:  Abdominal pain that does not go away or becomes severe.  Frequent vomiting.  No stool draining through the stoma.  Chest pain or an irregular heartbeat. Summary  Follow instructions from your health care provider about how to take care of your incision and stoma.  Contact a health care provider if you have trouble caring for your stoma or changing the colostomy bag.  Get help right away if you have abdominal pain that does not go away or becomes severe or if you have no stool draining through the stoma.  Keep all follow-up visits as told by your health care provider. This is important. This information is not intended to replace advice given to you by your health care provider. Make sure you discuss any questions you have with your health care provider. Document Revised: 11/19/2017 Document Reviewed: 11/19/2017 Elsevier Patient Education  2020 [employer] Colostomy Home Guide, Adult  Colostomy surgery is done to create an opening in the front of the abdomen for stool (feces) to leave the body through an ostomy (stoma). Part of the large intestine is attached to the stoma. A bag, also called a pouch, is fitted over the stoma. Stool and gas will collect in the bag. After surgery, you will need to empty  and change your colostomy bag as needed. You will also need to care for your stoma. How to care for the stoma Your stoma should look pink, red, and moist, like the inside of your cheek. Soon after surgery, the stoma may be swollen, but this swelling will go away within 6 weeks. To care for the stoma:  Keep the skin around the stoma clean and dry.  Use a clean, soft washcloth to gently wash the stoma and the skin around it. Clean using a circular motion, and wipe away from the stoma opening, not toward it. ? Use warm water and only use cleansers recommended by your health care provider. ? Rinse the stoma area with plain water. ? Dry the area around the stoma well.  Use stoma powder or ointment on your skin only as told by your health care provider. Do not use any other powders, gels, wipes, or creams on the skin around the stoma.  Check the stoma area every  day for signs of infection. Check for: ? New or worsening redness, swelling, or pain. ? New or increased fluid or blood. ? Pus or warmth.  Measure the stoma opening regularly and record the size. Watch for changes. (It is normal for the stoma to get smaller as swelling goes away.) Share this information with your health care provider. How to empty the colostomy bag  Empty your bag at bedtime and whenever it is one-third to one-half full. Do not let the bag get more than half-full with stool or gas. The bag could leak if it gets too full. Some colostomy bags have a built-in gas release valve that releases gas often throughout the day. Follow these basic steps: 1. Wash your hands with soap and water. 2. Sit far back on the toilet seat. 3. Put several pieces of toilet paper into the toilet water. This will prevent splashing as you empty stool into the toilet. 4. Remove the clip or the hook-and-loop fastener from the tail end of the bag. 5. Unroll the tail, then empty the stool into the toilet. 6. Clean the tail with toilet paper or a moist  towelette. 7. Reroll the tail, and close it with the clip or the hook-and-loop fastener. 8. Wash your hands again. How to change the colostomy bag Change your bag every 3-4 days or as often as told by your health care provider. Also change the bag if it is leaking or separating from the skin, or if your skin around the stoma looks or feels irritated. Irritated skin may be a sign that the bag is leaking. Always have colostomy supplies with you, and follow these basic steps: 1. Wash your hands with soap and water. Have paper towels or tissues nearby to clean any discharge. 2. Remove the old bag and skin barrier. Use your fingers or a warm cloth to gently push the skin away from the barrier. 3. Clean the stoma area with water or with mild soap and water, as directed. Use water to rinse away any soap. 4. Dry the skin. You may use the cool setting on a hair dryer to do this. 5. Use a tracing pattern (template) to cut the skin barrier to the size needed. 6. If you are using a two-piece bag, attach the bag and the skin barrier to each other. Add the barrier ring, if you use one. 7. If directed, apply stoma powder or skin barrier gel to the skin. 8. Warm the skin barrier with your hands, or blow with a hair dryer for 5-10 seconds. 9. Remove the paper from the adhesive strip of the skin barrier. 10. Press the adhesive strip onto the skin around the stoma. 11. Gently rub the skin barrier onto the skin. This creates heat that helps the barrier to stick. 12. Apply stoma tape to the edges of the skin barrier, if desired. 13. Wash your hands again. General recommendations  Avoid wearing tight clothes or having anything press directly on your stoma or bag. Change your clothing whenever it is soiled or damp.  You may shower or bathe with the bag on or off. Do not use harsh or oily soaps or lotions. Dry the skin and bag after bathing.  Store all supplies in a cool, dry place. Do not leave supplies in extreme  heat because some parts can melt or not stick as well.  Whenever you leave home, take extra clothing and an extra skin barrier and bag with you.  If your bag gets wet,  you can dry it with a hair dryer on the cool setting.  To prevent odor, you may put drops of ostomy deodorizer in the bag.  If recommended by your health care provider, put ostomy lubricant inside the bag. This helps stool to slide out of the bag more easily and completely. Contact a health care provider if:  You have new or worsening redness, swelling, or pain around your stoma.  You have new or increased fluid or blood coming from your stoma.  Your stoma feels warm to the touch.  You have pus coming from your stoma.  Your stoma extends in or out farther than normal.  You need to change your bag every day.  You have a fever. Get help right away if:  Your stool is bloody.  You have nausea or you vomit.  You have trouble breathing. Summary  Measure your stoma opening regularly and record the size. Watch for changes.  Empty your bag at bedtime and whenever it is one-third to one-half full. Do not let the bag get more than half-full with stool or gas.  Change your bag every 3-4 days or as often as told by your health care provider.  Whenever you leave home, take extra clothing and an extra skin barrier and bag with you. This information is not intended to replace advice given to you by your health care provider. Make sure you discuss any questions you have with your health care provider. Document Revised: 11/12/2018 Document Reviewed: 01/16/2017 Elsevier Patient Education  2020 [employer]. [employer]      [facility], [place] [phone]  OPEN ABDOMINAL SURGERY: POST OP INSTRUCTIONS  Always review your discharge instruction sheet given to you by the facility where your surgery was performed.  IF YOU HAVE DISABILITY OR FAMILY LEAVE FORMS, YOU MUST BRING THEM TO THE OFFICE FOR PROCESSING.  PLEASE DO  NOT GIVE THEM TO YOUR DOCTOR.  1. A prescription for pain medication may be given to you upon discharge.  Take your pain medication as prescribed, if needed.  If narcotic pain medicine is not needed, then you may take acetaminophen (Tylenol) or ibuprofen (Advil) as needed. 2. Take your usually prescribed medications unless otherwise directed. 3. If you need a refill on your pain medication, please contact your pharmacy. They will contact our office to request authorization.  Prescriptions will not be filled after 5pm or on week-ends. 4. You should follow a light diet the first few days after arrival home, such as soup and crackers, pudding, etc.unless your doctor has advised otherwise. A high-fiber, low fat diet can be resumed as tolerated.   Be sure to include lots of fluids daily. Most patients will experience some swelling and bruising on the chest and neck area.  Ice packs will help.  Swelling and bruising can take several days to resolve 5. Most patients will experience some swelling and bruising in the area of the incision. Ice pack will help. Swelling and bruising can take several days to resolve..  6. It is common to experience some constipation if taking pain medication after surgery.  Increasing fluid intake and taking a stool softener will usually help or prevent this problem from occurring.  A mild laxative (Milk of Magnesia or Miralax) should be taken according to package directions if there are no bowel movements after 48 hours. 7.  You may have steri-strips (small skin tapes) in place directly over the incision.  These strips should be left on the skin for 7-10 days.  If your surgeon used skin glue on the incision, you may shower in 24 hours.  The glue will flake off over the next 2-3 weeks.  Any sutures or staples will be removed at the office during your follow-up visit. You may find that a light gauze bandage over your incision may keep your staples from being rubbed or pulled. You may  shower and replace the bandage daily. 8. ACTIVITIES:  You may resume regular (light) daily activities beginning the next day--such as daily self-care, walking, climbing stairs--gradually increasing activities as tolerated.  You may have sexual intercourse when it is comfortable.  Refrain from any heavy lifting or straining until approved by your doctor. a. You may drive when you no longer are taking prescription pain medication, you can comfortably wear a seatbelt, and you can safely maneuver your car and apply brakes b. Return to Work: ___________________________________ 69. You should see your doctor in the office for a follow-up appointment approximately two weeks after your surgery.  Make sure that you call for this appointment within a day or two after you arrive home to insure a convenient appointment time. OTHER INSTRUCTIONS:  _____________________________________________________________ _____________________________________________________________  WHEN TO CALL YOUR DOCTOR: 1. Fever over 101.0 2. Inability to urinate 3. Nausea and/or vomiting 4. Extreme swelling or bruising 5. Continued bleeding from incision. 6. Increased pain, redness, or drainage from the incision. 7. Difficulty swallowing or breathing 8. Muscle cramping or spasms. 9. Numbness or tingling in hands or feet or around lips.  The clinic staff is available to answer your questions during regular business hours.  Please don't hesitate to call and ask to speak to one of the nurses if you have concerns.  For further questions, please visit [email or web address]   Colostomy Home Guide, Adult  Colostomy surgery is done to create an opening in the front of the abdomen for stool (feces) to leave the body through an ostomy (stoma). Part of the large intestine is attached to the stoma. A bag, also called a pouch, is fitted over the stoma. Stool and gas will collect in the bag. After surgery, you will need to empty  and change your colostomy bag as needed. You will also need to care for your stoma. How to care for the stoma Your stoma should look pink, red, and moist, like the inside of your cheek. Soon after surgery, the stoma may be swollen, but this swelling will go away within 6 weeks. To care for the stoma:  Keep the skin around the stoma clean and dry.  Use a clean, soft washcloth to gently wash the stoma and the skin around it. Clean using a circular motion, and wipe away from the stoma opening, not toward it. ? Use warm water and only use cleansers recommended by your health care provider. ? Rinse the stoma area with plain water. ? Dry the area around the stoma well.  Use stoma powder or ointment on your skin only as told by your health care provider. Do not use any other powders, gels, wipes, or creams on the skin around the stoma.  Check the stoma area every day for signs of infection. Check for: ? New or worsening redness, swelling, or pain. ? New or increased fluid or blood. ? Pus or warmth.  Measure the stoma opening regularly and record the size. Watch for changes. (It is normal for the stoma to get smaller as swelling goes away.) Share this information with your health care provider. How to empty the colostomy bag  Empty your bag at bedtime and whenever it is one-third to one-half full. Do not let the bag get more than half-full with stool or gas. The bag could leak if it gets too full. Some colostomy bags have a built-in gas release valve that releases gas often throughout the day. Follow these basic steps: 9. Wash your hands with soap and water. 10. Sit far back on the toilet seat. 11. Put several pieces of toilet paper into the toilet water. This will prevent splashing as you empty stool into the toilet. 12. Remove the clip or the hook-and-loop fastener from the tail end of the bag. 13. Unroll the tail, then empty the stool into the toilet. 14. Clean the tail with toilet paper or a  moist towelette. 15. Reroll the tail, and close it with the clip or the hook-and-loop fastener. 16. Wash your hands again. How to change the colostomy bag Change your bag every 3-4 days or as often as told by your health care provider. Also change the bag if it is leaking or separating from the skin, or if your skin around the stoma looks or feels irritated. Irritated skin may be a sign that the bag is leaking. Always have colostomy supplies with you, and follow these basic steps: 14. Wash your hands with soap and water. Have paper towels or tissues nearby to clean any discharge. 15. Remove the old bag and skin barrier. Use your fingers or a warm cloth to gently push the skin away from the barrier. 16. Clean the stoma area with water or with mild soap and water, as directed. Use water to rinse away any soap. 17. Dry the skin. You may use the cool setting on a hair dryer to do this. 18. Use a tracing pattern (template) to cut the skin barrier to the size needed. 19. If you are using a two-piece bag, attach the bag and the skin barrier to each other. Add the barrier ring, if you use one. 20. If directed, apply stoma powder or skin barrier gel to the skin. 21. Warm the skin barrier with your hands, or blow with a hair dryer for 5-10 seconds. 22. Remove the paper from the adhesive strip of the skin barrier. 23. Press the adhesive strip onto the skin around the stoma. 24. Gently rub the skin barrier onto the skin. This creates heat that helps the barrier to stick. 25. Apply stoma tape to the edges of the skin barrier, if desired. 26. Wash your hands again. General recommendations  Avoid wearing tight clothes or having anything press directly on your stoma or bag. Change your clothing whenever it is soiled or damp.  You may shower or bathe with the bag on or off. Do not use harsh or oily soaps or lotions. Dry the skin and bag after bathing.  Store all supplies in a cool, dry place. Do not leave  supplies in extreme heat because some parts can melt or not stick as well.  Whenever you leave home, take extra clothing and an extra skin barrier and bag with you.  If your bag gets wet, you can dry it with a hair dryer on the cool setting.  To prevent odor, you may put drops of ostomy deodorizer in the bag.  If recommended by your health care provider, put ostomy lubricant inside the bag. This helps stool to slide out of the bag more easily and completely. Contact a health care provider if:  You have new or worsening redness, swelling,  or pain around your stoma.  You have new or increased fluid or blood coming from your stoma.  Your stoma feels warm to the touch.  You have pus coming from your stoma.  Your stoma extends in or out farther than normal.  You need to change your bag every day.  You have a fever. Get help right away if:  Your stool is bloody.  You have nausea or you vomit.  You have trouble breathing. Summary  Measure your stoma opening regularly and record the size. Watch for changes.  Empty your bag at bedtime and whenever it is one-third to one-half full. Do not let the bag get more than half-full with stool or gas.  Change your bag every 3-4 days or as often as told by your health care provider.  Whenever you leave home, take extra clothing and an extra skin barrier and bag with you. This information is not intended to replace advice given to you by your health care provider. Make sure you discuss any questions you have with your health care provider. Document Revised: 11/12/2018 Document Reviewed: 01/16/2017 Elsevier Patient Education  2020 [employer].

## 2019-12-24 NOTE — Progress Notes (Signed)
Pt discharge to home, instructions reviewed with pt and wife, acknowledged understanding of instructions. Ostomy teaching completed by [facility] and questions addressed. SRP, RN

## 2019-12-24 NOTE — Discharge Summary (Signed)
Physician Discharge Summary  Shawn Reese NWG:[id number] DOB: 1961/08/03 DOA: 12/18/2019  PCP: Patient, No Pcp Per  Admit date: 12/18/2019 Discharge date: 12/24/2019  Admitted From: Home  Disposition:  Home   Recommendations for Outpatient Follow-up and new medication changes:  1. Follow up with Primary Care in 7 days.  2. Continue antibiotic therapy for 5 more days.  3. Continue antibiotic therapy for 5 more days.   Home Health: no   Equipment/Devices: no    Discharge Condition: stable CODE STATUS: full  Diet recommendation: soft regular.   Brief/Interim Summary: Patient was admitted to the hospital with a working diagnosis of severe colitis/ischemic, complicated by colonic obstruction.  Now s/p exploratory laparotomy, partial colectomy with end colostomy 98/76.  59 year old male who presented with diarrhea, nausea and dry heaves.  Recent travel [place]  Possible bloody stools.  On his initial physical examination blood pressure 142/91, heart rate 73, respiratory rate 18, temperature 98.3, oxygen saturation 96%.  His lungs were clear to auscultation bilaterally, heart S1-S2 present rhythmic, his abdomen was diffusely tender, distended, positive bowel sounds.  Positive guarding but no rebound.  No lower extremity edema. Sodium 130, potassium 3.3, chloride 92, bicarb 26, glucose 128, BUN 18, creatinine 0.70, AST 16, ALT 21, white count 22.3, hemoglobin 15.8, hematocrit 45.7, platelets 346.  SARS COVID-19 negative.  Urinalysis negative for infection. CT of the abdomen with signs of colonic obstruction.  Transition point at the sigmoid colon, irregular masslike narrowing the bowel lumen.  Patient underwent flexible sigmoidoscopy, showing congested, erythematous, inflamed, ulcerated vascular pattern and thickened folds of mucosa in the sigmoid colon and descending colon.  Patient was placed on antibiotic therapy with ceftriaxone and metronidazole.  May 15 patient  underwent expiratory laparotomy with partial colectomy with end colostomy.  1.  Acute colonic obstruction/diverticulitis/ulcerated lesions.  Patient initially medically treated, continue to have severe symptoms and he underwent surgical intervention with colectomy and end colostomy.  Postoperatively his symptoms continue to improve, his discharge white cell count is 14.0, his blood cultures are no growth, his gastrointestinal panel was negative for infection.  Surgical pathology was positive for diverticulitis, and numerous healing ulcers including both margins.  Specific infectious agent note seen.  His diet was advanced with good toleration.  Patient will continue antibiotic therapy for 5 more days, follow-up as an outpatient.  2.  Hyponatremia, hypocalcemia and hypokalemia.  Patient received intravenous fluids and his electrolytes were corrected.  By time of discharge he is tolerating p.o. adequately.  His discharge sodium is 133, potassium 3.8, chloride 100, bicarb 25, glucose 95, BUN 11, creatinine 0.69.   Discharge Diagnoses:  Principal Problem:   Colon obstruction [facility]) Active Problems:   Bowel obstruction ([place])   Hyponatremia   Hypokalemia   Hypocalcemia    Discharge Instructions   Allergies as of 12/24/2019   No Known Allergies     Medication List    STOP taking these medications   ibuprofen 200 MG tablet Commonly known as: ADVIL     TAKE these medications   acetaminophen 325 MG tablet Commonly known as: TYLENOL Take 2 tablets (650 mg total) by mouth every 6 (six) hours.   amoxicillin-clavulanate 875-125 MG tablet Commonly known as: AUGMENTIN Take 1 tablet by mouth every 12 (twelve) hours for 5 days.      Follow-up Information    Care, [facility] Follow up.   Specialty: Home Health Services Why: Home Health RN will call before coming to visit to make an  appointment. Please call [facility] if you have any questions or problems.  Contact  information: [address] [place] [id number] [phone]        Ovidio KinNewman, David, MD. Go on 01/14/2020.   Specialty: General Surgery Why: Follow up appointment scheduled for 1:30 PM. Please arrive 30 min prior to appointment time. Bring photo ID and insurance information.  Contact information: [address] [place] [id number] [phone]        Surgery, [facility]. Go on 12/29/2019.   Specialty: General Surgery Why: Follow up for staple removal scheduled for 10:00 AM. Please arrive 30 min prior to appointment time. Bring photo ID and insurance information.  Contact information: [address] [place] [id number] [phone]          No Known Allergies  Consultations:  Surgery   GI    Procedures/Studies: DG Abd 1 View  Result Date: 12/19/2019 CLINICAL DATA:  Abdominal pain EXAM: ABDOMEN - 1 VIEW COMPARISON:  CT Dec 18, 2019 FINDINGS: Dilated air-filled loops of colon are seen throughout, predominantly the transverse colon measuring up to 7.1 cm. Air-filled loops of nondilated small bowel seen within the mid abdomen. There is air in nondilated descending colon down to the level of the distal descending colon where there is an abrupt cutoff, likely due to the focal bowel wall narrowing as seen on recent CT. IMPRESSION: Mildly dilated colon to the level of the descending colon consistent with large bowel obstruction. Electronically Signed   By: Jonna ClarkBindu  Avutu M.D.   On: 12/19/2019 03:58   CT Abdomen Pelvis W Contrast  Result Date: 12/18/2019 CLINICAL DATA:  Abdomen distension diarrhea EXAM: CT ABDOMEN AND PELVIS WITH CONTRAST TECHNIQUE: Multidetector CT imaging of the abdomen and pelvis was performed using the standard protocol following bolus administration of intravenous contrast. CONTRAST:  100mL OMNIPAQUE IOHEXOL 300 MG/ML  SOLN COMPARISON:  None. FINDINGS: Lower chest: Lung bases demonstrate subsegmental atelectasis at the  right base. No consolidation or pleural effusion. Cardiac size within normal limits. Hepatobiliary: Subcentimeter hypodensity in the left hepatic lobe too small to characterize. No calcified gallstone or biliary dilatation. Pancreas: Unremarkable. No pancreatic ductal dilatation or surrounding inflammatory changes. Spleen: Normal in size without focal abnormality. Adrenals/Urinary Tract: Mild nodular thickening of the adrenal glands without dominant mass. Multiple subcentimeter hypodense renal lesions too small to further characterize. No hydronephrosis. The bladder is normal Stomach/Bowel: The stomach is nonenlarged. Fluid distended nondilated terminal ileum. Air and fluid distension of the: Measuring up to 9.2 cm. Wall thickening of the descending colon with surrounding inflammatory process. Irregular wall thickening at the sigmoid colon serving as the point of transition. Diffuse diverticular disease of the colon. Negative appendix. Linear peripherally oriented gas collections within the ascending and transverse colon raising concern for intramural air. Negative for portal venous gas. Soft tissue stranding about the right colon as well. Possible small intramural gas and fluid collections within the sigmoid:, for example series 2, image number 77, left-sided 2.5 cm gas and fluid collection. Additional 18 mm gas and fluid collection on the right side, series 2, image number 83. Vascular/Lymphatic: Moderate aortic atherosclerosis. No aneurysm. Visible portions of the SMA and SMV enhance normally. No suspicious adenopathy Reproductive: Prostate is unremarkable. Other: No free air. Small free fluid in the pelvis. Small amount of fluid in the pararenal spaces. Musculoskeletal: No acute or suspicious osseous abnormality. IMPRESSION: 1. Findings consistent with colon obstruction. Point of transition is at the sigmoid colon  where there is irregular masslike narrowing of the bowel with bowel wall thickening. Primary  concern is obstructing neoplasm, though inflammatory stricture is also considered given the presence of diverticular disease in the colon. Potential small intramural abscess within the sigmoid colon upstream and downstream to the point of obstruction. Concern raised for intramural air involving the ascending and transverse colon, given appearance of linear peripherally distributed gas collections. No portal venous gas or free air visible at this time. 2. Mild colon wall thickening/colitis of the descending colon with surrounding inflammatory changes. Pericolonic inflammatory changes involving the right colon as well though without substantial colon wall thickening at this location. 3. Free fluid in the pelvis. Critical Value/emergent results were called by telephone at the time of interpretation on 12/18/2019 at 7:53 pm to provider [facility] , who verbally acknowledged these results. Electronically Signed   By: Jasmine Pang M.D.   On: 12/18/2019 19:54      Procedures: Flexible sigmoidoscopy, colectomy with colostomy.  Subjective: Patient is feeling better, no nausea or vomiting, tolerating po well. No chest pain.   Discharge Exam: Vitals:   12/23/19 2157 12/24/19 0522  BP: 126/77 120/82  Pulse: 68 66  Resp: 18 18  Temp: 98.3 F (36.8 C) 98.7 F (37.1 C)  SpO2: 95% 96%   Vitals:   12/23/19 0503 12/23/19 1240 12/23/19 2157 12/24/19 0522  BP: 134/85 122/78 126/77 120/82  Pulse: 80 70 68 66  Resp: 18 18 18 18   Temp: 98 F (36.7 C) 99.6 F (37.6 C) 98.3 F (36.8 C) 98.7 F (37.1 C)  TempSrc: Oral Oral Oral Oral  SpO2: 96% 96% 95% 96%  Weight:      Height:        General: Not in pain or dyspnea.  Neurology: Awake and alert, non focal  E ENT: mild pallor, no icterus, oral mucosa moist Cardiovascular: No JVD. S1-S2 present, rhythmic, no gallops, rubs, or murmurs. No lower extremity edema. Pulmonary: positive breath sounds bilaterally, adequate air movement, no wheezing, rhonchi  or rales. Gastrointestinal. Abdomen with, no organomegaly, non tender, no rebound or guarding. Colostomy bag in place.  Skin. No rashes Musculoskeletal: no joint deformities   The results of significant diagnostics from this hospitalization (including imaging, microbiology, ancillary and laboratory) are listed below for reference.     Microbiology: Recent Results (from the past 240 hour(s))  Culture, blood (routine x 2)     Status: None   Collection Time: 12/18/19  6:20 PM   Specimen: BLOOD  Result Value Ref Range Status   Specimen Description   Final    BLOOD RIGHT ANTECUBITAL Performed at [facility], [address] [place], [place]    Special Requests   Final    BOTTLES DRAWN AEROBIC AND ANAEROBIC Blood Culture adequate volume Performed at [facility], [address] [place], [place]    Culture   Final    NO GROWTH 5 DAYS Performed at [facility], [address], [place], [place]    Report Status 12/23/2019 FINAL  Final  Culture, blood (routine x 2)     Status: None   Collection Time: 12/18/19  6:25 PM   Specimen: BLOOD  Result Value Ref Range Status   Specimen Description   Final    BLOOD LEFT ANTECUBITAL Performed at [facility], [address], [place], [place]    Special Requests   Final  BOTTLES DRAWN AEROBIC AND ANAEROBIC Blood Culture adequate volume Performed at [facility], [address] [place], [place] [address]    Culture   Final    NO GROWTH 5 DAYS Performed at [facility], [address], [place], [place] [address]    Report Status 12/23/2019 FINAL  Final  SARS Coronavirus 2 by RT PCR (hospital order, performed in [facility] hospital lab) Nasopharyngeal Nasopharyngeal Swab     Status: None   Collection Time: 12/18/19  8:27 PM   Specimen: Nasopharyngeal Swab  Result Value Ref Range Status   SARS Coronavirus 2 NEGATIVE NEGATIVE Final    Comment:  (NOTE) SARS-CoV-2 target nucleic acids are NOT DETECTED. The SARS-CoV-2 RNA is generally detectable in upper and lower respiratory specimens during the acute phase of infection. The lowest concentration of SARS-CoV-2 viral copies this assay can detect is 250 copies / mL. A negative result does not preclude SARS-CoV-2 infection and should not be used as the sole basis for treatment or other patient management decisions.  A negative result may occur with improper specimen collection / handling, submission of specimen other than nasopharyngeal swab, presence of viral mutation(s) within the areas targeted by this assay, and inadequate number of viral copies (<250 copies / mL). A negative result must be combined with clinical observations, patient history, and epidemiological information. Fact Sheet for Patients:   BoilerBrush.com.cy Fact Sheet for Healthcare Providers: https://pope.com/ This test is not yet approved or cleared  by the [place] FDA and has been authorized for detection and/or diagnosis of SARS-CoV-2 by FDA under an Emergency Use Authorization (EUA).  This EUA will remain in effect (meaning this test can be used) for the duration of the COVID-19 declaration under Section 564(b)(1) of the Act, 21 U.S.C. section 360bbb-3(b)(1), unless the authorization is terminated or revoked sooner. Performed at [facility], [address] [place], [place] [address]   Urine culture     Status: None   Collection Time: 12/19/19  9:10 AM   Specimen: Urine, Random  Result Value Ref Range Status   Specimen Description   Final    URINE, RANDOM Performed at [facility], [address] [place], [place] [address]    Special Requests   Final    NONE Performed at [facility], [address], [place], [place] [address]    Culture   Final    NO GROWTH Performed at [facility],  [address], [place], [place] [address]    Report Status 12/20/2019 FINAL  Final  Gastrointestinal Panel by PCR , Stool     Status: None   Collection Time: 12/23/19 11:08 AM   Specimen: Colostomy Stoma; Stool  Result Value Ref Range Status   Campylobacter species NOT DETECTED NOT DETECTED Final   Plesimonas shigelloides NOT DETECTED NOT DETECTED Final   Salmonella species NOT DETECTED NOT DETECTED Final   Yersinia enterocolitica NOT DETECTED NOT DETECTED Final   Vibrio species NOT DETECTED NOT DETECTED Final   Vibrio cholerae NOT DETECTED NOT DETECTED Final   Enteroaggregative E coli (EAEC) NOT DETECTED NOT DETECTED Final   Enteropathogenic E coli (EPEC) NOT DETECTED NOT DETECTED Final   Enterotoxigenic E coli (ETEC) NOT DETECTED NOT DETECTED Final   Shiga like toxin producing E coli (STEC) NOT DETECTED NOT DETECTED Final   Shigella/Enteroinvasive E coli (EIEC) NOT DETECTED NOT DETECTED Final   Cryptosporidium NOT DETECTED NOT DETECTED Final  Cyclospora cayetanensis NOT DETECTED NOT DETECTED Final   Entamoeba histolytica NOT DETECTED NOT DETECTED Final   Giardia lamblia NOT DETECTED NOT DETECTED Final   Adenovirus F40/41 NOT DETECTED NOT DETECTED Final   Astrovirus NOT DETECTED NOT DETECTED Final   Norovirus GI/GII NOT DETECTED NOT DETECTED Final   Rotavirus A NOT DETECTED NOT DETECTED Final   Sapovirus (I, II, IV, and V) NOT DETECTED NOT DETECTED Final    Comment: Performed at [facility], [address] [place], [place] [address]     Labs: BNP (last 3 results) No results for input(s): BNP in the last 8760 hours. Basic Metabolic Panel: Recent Labs  Lab 12/20/19 0509 12/21/19 0340 12/22/19 0357 12/23/19 0334 12/24/19 0440  NA 138 141 137 138 133*  K 3.8 3.2* 4.1 4.1 3.8  CL 101 100 101 99 100  CO2 29 32 GLUCOSE 148* 124* 106* 106* 95  BUN CREATININE 0.61 0.75 0.74 0.78 0.69  CALCIUM 7.4* 7.7* 7.8* 8.7* 8.1*  MG 1.9  --  1.9  --    --   PHOS 3.6  --  3.0  --   --    Liver Function Tests: Recent Labs  Lab 12/18/19 1654 12/19/19 0221 12/20/19 0509 12/22/19 0357  AST 16 10*  --   --   ALT 21 17  --   --   ALKPHOS 86 70  --   --   BILITOT 1.0 0.8  --   --   PROT 6.9 5.5*  --   --   ALBUMIN 3.3* 2.6* 2.3* 2.5*   Recent Labs  Lab 12/18/19 1654  LIPASE 20   No results for input(s): AMMONIA in the last 168 hours. CBC: Recent Labs  Lab 12/18/19 1654 12/18/19 1654 12/19/19 0221 12/19/19 0221 12/20/19 0509 12/21/19 0340 12/22/19 0357 12/23/19 0334 12/24/19 0440  WBC 22.3*   < > 19.5*   < > 15.5* 13.0* 12.0* 15.7* 14.0*  NEUTROABS 17.1*  --  16.4*  --  13.2* 9.2*  --   --   --   HGB 15.8   < > 13.7   < > 13.2 13.7 13.8 15.2 13.9  HCT 45.7   < > 41.6   < > 40.8 42.3 43.1 47.0 42.0  MCV 89.4   < > 92.0   < > 92.7 94.6 96.4 93.6 92.7  PLT 346   < > 289   < > 318 330 342 400 375   < > = values in this interval not displayed.   Cardiac Enzymes: No results for input(s): CKTOTAL, CKMB, CKMBINDEX, TROPONINI in the last 168 hours. BNP: Invalid input(s): POCBNP CBG: Recent Labs  Lab 12/20/19 0800 12/21/19 0818 12/22/19 0742 12/23/19 0738 12/24/19 0734  GLUCAP 118* 121* 105* 94 114*   D-Dimer No results for input(s): DDIMER in the last 72 hours. Hgb A1c No results for input(s): HGBA1C in the last 72 hours. Lipid Profile No results for input(s): CHOL, HDL, LDLCALC, TRIG, CHOLHDL, LDLDIRECT in the last 72 hours. Thyroid function studies No results for input(s): TSH, T4TOTAL, T3FREE, THYROIDAB in the last 72 hours.  Invalid input(s): FREET3 Anemia work up No results for input(s): VITAMINB12, FOLATE, FERRITIN, TIBC, IRON, RETICCTPCT in the last 72 hours. Urinalysis    Component Value Date/Time   COLORURINE YELLOW 12/18/2019 1654   APPEARANCEUR CLEAR 12/18/2019 1654   LABSPEC 1.025 12/18/2019 1654   PHURINE 6.0 12/18/2019 1654  GLUCOSEU NEGATIVE 12/18/2019 1654   HGBUR NEGATIVE 12/18/2019 1654    BILIRUBINUR SMALL (A) 12/18/2019 1654   KETONESUR NEGATIVE 12/18/2019 1654   PROTEINUR 30 (A) 12/18/2019 1654   NITRITE NEGATIVE 12/18/2019 1654   LEUKOCYTESUR NEGATIVE 12/18/2019 1654   Sepsis Labs Invalid input(s): PROCALCITONIN,  WBC,  LACTICIDVEN Microbiology Recent Results (from the past 240 hour(s))  Culture, blood (routine x 2)     Status: None   Collection Time: 12/18/19  6:20 PM   Specimen: BLOOD  Result Value Ref Range Status   Specimen Description   Final    BLOOD RIGHT ANTECUBITAL Performed at [facility], [address] [place], [place] [address]    Special Requests   Final    BOTTLES DRAWN AEROBIC AND ANAEROBIC Blood Culture adequate volume Performed at [facility], [address] [place], [place] [address]    Culture   Final    NO GROWTH 5 DAYS Performed at [facility], [address], [place], [place] [address]    Report Status 12/23/2019 FINAL  Final  Culture, blood (routine x 2)     Status: None   Collection Time: 12/18/19  6:25 PM   Specimen: BLOOD  Result Value Ref Range Status   Specimen Description   Final    BLOOD LEFT ANTECUBITAL Performed at [facility], [address] [place], [place] [address]    Special Requests   Final    BOTTLES DRAWN AEROBIC AND ANAEROBIC Blood Culture adequate volume Performed at [facility], [address] [place], [place] [address]    Culture   Final    NO GROWTH 5 DAYS Performed at [facility], [address] [place], [place] [address]    Report Status 12/23/2019 FINAL  Final  SARS Coronavirus 2 by RT PCR (hospital order, performed in [facility] hospital lab) Nasopharyngeal Nasopharyngeal Swab     Status: None   Collection Time: 12/18/19  8:27 PM   Specimen: Nasopharyngeal Swab  Result Value Ref Range Status   SARS Coronavirus 2 NEGATIVE NEGATIVE Final    Comment: (NOTE) SARS-CoV-2 target nucleic acids are NOT DETECTED. The SARS-CoV-2 RNA is  generally detectable in upper and lower respiratory specimens during the acute phase of infection. The lowest concentration of SARS-CoV-2 viral copies this assay can detect is 250 copies / mL. A negative result does not preclude SARS-CoV-2 infection and should not be used as the sole basis for treatment or other patient management decisions.  A negative result may occur with improper specimen collection / handling, submission of specimen other than nasopharyngeal swab, presence of viral mutation(s) within the areas targeted by this assay, and inadequate number of viral copies (<250 copies / mL). A negative result must be combined with clinical observations, patient history, and epidemiological information. Fact Sheet for Patients:   BoilerBrush.com.cy Fact Sheet for Healthcare Providers: https://pope.com/ This test is not yet approved or cleared  by the [place] FDA and has been authorized for detection and/or diagnosis of SARS-CoV-2 by FDA under an Emergency Use Authorization (EUA).  This EUA will remain in effect (meaning this test can be used) for the duration of the COVID-19 declaration under Section 564(b)(1) of the Act, 21 U.S.C. section 360bbb-3(b)(1), unless the authorization is terminated or revoked sooner. Performed at [facility], [address] [place], [place] [address]   Urine culture     Status: None  Collection Time: 12/19/19  9:10 AM   Specimen: Urine, Random  Result Value Ref Range Status   Specimen Description   Final    URINE, RANDOM Performed at [facility], [address] [place], [place] [address]    Special Requests   Final    NONE Performed at [facility], [address], [place], [place] [address]    Culture   Final    NO GROWTH Performed at [facility], [address] [place], [place] [address]    Report Status 12/20/2019 FINAL  Final   Gastrointestinal Panel by PCR , Stool     Status: None   Collection Time: 12/23/19 11:08 AM   Specimen: Colostomy Stoma; Stool  Result Value Ref Range Status   Campylobacter species NOT DETECTED NOT DETECTED Final   Plesimonas shigelloides NOT DETECTED NOT DETECTED Final   Salmonella species NOT DETECTED NOT DETECTED Final   Yersinia enterocolitica NOT DETECTED NOT DETECTED Final   Vibrio species NOT DETECTED NOT DETECTED Final   Vibrio cholerae NOT DETECTED NOT DETECTED Final   Enteroaggregative E coli (EAEC) NOT DETECTED NOT DETECTED Final   Enteropathogenic E coli (EPEC) NOT DETECTED NOT DETECTED Final   Enterotoxigenic E coli (ETEC) NOT DETECTED NOT DETECTED Final   Shiga like toxin producing E coli (STEC) NOT DETECTED NOT DETECTED Final   Shigella/Enteroinvasive E coli (EIEC) NOT DETECTED NOT DETECTED Final   Cryptosporidium NOT DETECTED NOT DETECTED Final   Cyclospora cayetanensis NOT DETECTED NOT DETECTED Final   Entamoeba histolytica NOT DETECTED NOT DETECTED Final   Giardia lamblia NOT DETECTED NOT DETECTED Final   Adenovirus F40/41 NOT DETECTED NOT DETECTED Final   Astrovirus NOT DETECTED NOT DETECTED Final   Norovirus GI/GII NOT DETECTED NOT DETECTED Final   Rotavirus A NOT DETECTED NOT DETECTED Final   Sapovirus (I, II, IV, and V) NOT DETECTED NOT DETECTED Final    Comment: Performed at [facility], [address], [place], [place] [address]     Time coordinating discharge: 45 minutes  SIGNED:   Coralie Keens, MD  [facility] 12/24/2019, 12:31 PM

## 2020-05-05 ENCOUNTER — Other Ambulatory Visit: Payer: Self-pay | Admitting: Surgery

## 2020-05-06 ENCOUNTER — Other Ambulatory Visit: Payer: Self-pay | Admitting: Surgery

## 2020-05-06 DIAGNOSIS — Z933 Colostomy status: Secondary | ICD-10-CM

## 2020-05-12 ENCOUNTER — Ambulatory Visit
Admission: RE | Admit: 2020-05-12 | Discharge: 2020-05-12 | Disposition: A | Discharge status: Home / Self Care | Source: Ambulatory Visit | Attending: Surgery | Admitting: Surgery

## 2020-05-12 DIAGNOSIS — Z933 Colostomy status: Secondary | ICD-10-CM

## 2020-05-27 ENCOUNTER — Encounter (HOSPITAL_COMMUNITY): Payer: Self-pay

## 2020-05-27 ENCOUNTER — Encounter (HOSPITAL_COMMUNITY)
Admission: RE | Admit: 2020-05-27 | Discharge: 2020-05-27 | Disposition: A | Discharge status: Home / Self Care | Source: Ambulatory Visit | Attending: Surgery | Admitting: Surgery

## 2020-05-27 ENCOUNTER — Other Ambulatory Visit: Payer: Self-pay

## 2020-05-27 ENCOUNTER — Other Ambulatory Visit (HOSPITAL_COMMUNITY)
Admission: RE | Admit: 2020-05-27 | Discharge: 2020-05-27 | Disposition: A | Discharge status: Home / Self Care | Source: Ambulatory Visit | Attending: Surgery | Admitting: Surgery

## 2020-05-27 DIAGNOSIS — Z01812 Encounter for preprocedural laboratory examination: Secondary | ICD-10-CM | POA: Insufficient documentation

## 2020-05-27 DIAGNOSIS — Z20822 Contact with and (suspected) exposure to covid-19: Secondary | ICD-10-CM | POA: Insufficient documentation

## 2020-05-27 HISTORY — DX: Personal history of colonic polyps: Z86.010

## 2020-05-27 HISTORY — DX: Personal history of colon polyps, unspecified: Z86.0100

## 2020-05-27 LAB — COMPREHENSIVE METABOLIC PANEL
ALT: 17 U/L (ref 0–44)
AST: 19 U/L (ref 15–41)
Albumin: 4.6 g/dL (ref 3.5–5.0)
Alkaline Phosphatase: 52 U/L (ref 38–126)
Anion gap: 10 (ref 5–15)
BUN: 11 mg/dL (ref 8–23)
CO2: 26 mmol/L (ref 22–32)
Calcium: 9.1 mg/dL (ref 8.9–10.3)
Chloride: 102 mmol/L (ref 98–111)
Creatinine, Ser: 0.76 mg/dL (ref 0.61–1.24)
GFR, Estimated: >60 mL/min (ref >60)
Glucose, Bld: 84 mg/dL (ref 70–99)
Potassium: 4 mmol/L (ref 3.5–5.1)
Sodium: 138 mmol/L (ref 135–145)
Total Bilirubin: 2 mg/dL — ABNORMAL HIGH (ref 0.3–1.2)
Total Protein: 7.4 g/dL (ref 6.5–8.1)

## 2020-05-27 LAB — CBC WITH DIFFERENTIAL/PLATELET
Abs Immature Granulocytes: 0.03 10*3/uL (ref 0.00–0.07)
Basophils Absolute: 0.1 10*3/uL (ref 0.0–0.1)
Basophils Relative: 1 %
Eosinophils Absolute: 0.2 10*3/uL (ref 0.0–0.5)
Eosinophils Relative: 2 %
HCT: 47 % (ref 39.0–52.0)
Hemoglobin: 15.6 g/dL (ref 13.0–17.0)
Immature Granulocytes: 0 %
Lymphocytes Relative: 30 %
Lymphs Abs: 2.5 10*3/uL (ref 0.7–4.0)
MCH: 30.8 pg (ref 26.0–34.0)
MCHC: 33.2 g/dL (ref 30.0–36.0)
MCV: 92.9 fL (ref 80.0–100.0)
Monocytes Absolute: 0.8 10*3/uL (ref 0.1–1.0)
Monocytes Relative: 10 %
Neutro Abs: 4.8 10*3/uL (ref 1.7–7.7)
Neutrophils Relative %: 57 %
Platelets: 286 10*3/uL (ref 150–400)
RBC: 5.06 MIL/uL (ref 4.22–5.81)
RDW: 13.2 % (ref 11.5–15.5)
WBC: 8.3 10*3/uL (ref 4.0–10.5)
nRBC: 0 % (ref 0.0–0.2)

## 2020-05-27 LAB — SARS CORONAVIRUS 2 (TAT 6-24 HRS): SARS Coronavirus 2: NEGATIVE

## 2020-05-27 NOTE — Patient Instructions (Signed)
DUE TO COVID-19 ONLY ONE VISITOR IS ALLOWED TO COME WITH YOU AND STAY IN THE WAITING ROOM ONLY DURING PRE OP AND PROCEDURE.   IF YOU WILL BE ADMITTED INTO THE HOSPITAL YOU ARE ALLOWED ONE SUPPORT PERSON DURING VISITATION HOURS ONLY (10AM -8PM)   . The support person may change daily. . The support person must pass our screening, gel in and out, and wear a mask at all times, including in the patient's room. . Patients must also wear a mask when staff or their support person are in the room.   COVID SWAB TESTING COMPLETED ON:  Today  (Must self quarantine after testing. Follow instructions on handout.)       Your procedure is scheduled on: Monday, Oct. 25, 2021   [facility] Main  Entrance    Report to admitting at 8:00 AM   Call this number if you have problems the morning of surgery [phone]   Do not eat food :After Midnight.   May have liquids until 7:00AM   day of surgery  CLEAR LIQUID DIET  Foods Allowed                                                                     Foods Excluded  Water, Black Coffee and tea, regular and decaf                             liquids that you cannot  Plain Jell-O in any flavor  (No red)                                           see through such as: Fruit ices (not with fruit pulp)                                     milk, soups, orange juice              Iced Popsicles (No red)                                    All solid food                                   Apple juices Sports drinks like Gatorade (No red) Lightly seasoned clear broth or consume(fat free) Sugar, honey syrup  Sample Menu Breakfast                                Lunch                                     Supper Cranberry juice  Beef broth                            Chicken broth Jell-O                                     Grape juice                           Apple juice Coffee or tea                        Jell-O                                       Popsicle                                                Coffee or tea                        Coffee or tea      Drink 2 Ensure drinks the night before surgery at 10:00 PM.     Complete one Ensure drink the morning of surgery at 7:00 AM   Oral Hygiene is also important to reduce your risk of infection.                                    Remember - BRUSH YOUR TEETH THE MORNING OF SURGERY WITH YOUR REGULAR TOOTHPASTE   Do NOT smoke after Midnight   Take these medicines the morning of surgery with A SIP OF WATER: None                               You may not have any metal on your body including  jewelry, and body piercings             Do not wear lotions, powders, perfumes/cologne, or deodorant                        Men may shave face and neck.   Do not bring valuables to the hospital. [facility] IS NOT             RESPONSIBLE   FOR VALUABLES.   Contacts, dentures or bridgework may not be worn into surgery.   Bring small overnight bag day of surgery.    Special Instructions: Bring a copy of your healthcare power of attorney and living will documents         the day of surgery if you haven't scanned them in before.              Please read over the following fact sheets you were given: IF YOU HAVE QUESTIONS ABOUT YOUR PRE OP INSTRUCTIONS PLEASE CALL [phone]   [place] - Preparing for Surgery Before surgery, you can play an important role.  Because skin is not sterile, your skin needs to be as free  of germs as possible.  You can reduce the number of germs on your skin by washing with CHG (chlorahexidine gluconate) soap before surgery.  CHG is an antiseptic cleaner which kills germs and bonds with the skin to continue killing germs even after washing. Please DO NOT use if you have an allergy to CHG or antibacterial soaps.  If your skin becomes reddened/irritated stop using the CHG and inform your nurse when you arrive at Short Stay. Do not shave (including legs  and underarms) for at least 48 hours prior to the first CHG shower.  You may shave your face/neck.  Please follow these instructions carefully:  1.  Shower with CHG Soap the night before surgery and the  morning of surgery.  2.  If you choose to wash your hair, wash your hair first as usual with your normal  shampoo.  3.  After you shampoo, rinse your hair and body thoroughly to remove the shampoo.                             4.  Use CHG as you would any other liquid soap.  You can apply chg directly to the skin and wash.  Gently with a scrungie or clean washcloth.  5.  Apply the CHG Soap to your body ONLY FROM THE NECK DOWN.   Do   not use on face/ open                           Wound or open sores. Avoid contact with eyes, ears mouth and   genitals (private parts).                       Wash face,  Genitals (private parts) with your normal soap.             6.  Wash thoroughly, paying special attention to the area where your    surgery  will be performed.  7.  Thoroughly rinse your body with warm water from the neck down.  8.  DO NOT shower/wash with your normal soap after using and rinsing off the CHG Soap.                9.  Pat yourself dry with a clean towel.            10.  Wear clean pajamas.            11.  Place clean sheets on your bed the night of your first shower and do not  sleep with pets. Day of Surgery : Do not apply any lotions/deodorants the morning of surgery.  Please wear clean clothes to the hospital/surgery center.  FAILURE TO FOLLOW THESE INSTRUCTIONS MAY RESULT IN THE CANCELLATION OF YOUR SURGERY  PATIENT SIGNATURE_________________________________  NURSE SIGNATURE__________________________________  ________________________________________________________________________   Shawn Reese  An incentive spirometer is a tool that can help keep your lungs clear and active. This tool measures how well you are filling your lungs with each breath. Taking long  deep breaths may help reverse or decrease the chance of developing breathing (pulmonary) problems (especially infection) following:  A long period of time when you are unable to move or be active. BEFORE THE PROCEDURE   If the spirometer includes an indicator to show your best effort, your nurse or respiratory therapist will set it to a desired goal.  If possible,  sit up straight or lean slightly forward. Try not to slouch.  Hold the incentive spirometer in an upright position. INSTRUCTIONS FOR USE  1. Sit on the edge of your bed if possible, or sit up as far as you can in bed or on a chair. 2. Hold the incentive spirometer in an upright position. 3. Breathe out normally. 4. Place the mouthpiece in your mouth and seal your lips tightly around it. 5. Breathe in slowly and as deeply as possible, raising the piston or the ball toward the top of the column. 6. Hold your breath for 3-5 seconds or for as long as possible. Allow the piston or ball to fall to the bottom of the column. 7. Remove the mouthpiece from your mouth and breathe out normally. 8. Rest for a few seconds and repeat Steps 1 through 7 at least 10 times every 1-2 hours when you are awake. Take your time and take a few normal breaths between deep breaths. 9. The spirometer may include an indicator to show your best effort. Use the indicator as a goal to work toward during each repetition. 10. After each set of 10 deep breaths, practice coughing to be sure your lungs are clear. If you have an incision (the cut made at the time of surgery), support your incision when coughing by placing a pillow or rolled up towels firmly against it. Once you are able to get out of bed, walk around indoors and cough well. You may stop using the incentive spirometer when instructed by your caregiver.  RISKS AND COMPLICATIONS  Take your time so you do not get dizzy or light-headed.  If you are in pain, you may need to take or ask for pain medication  before doing incentive spirometry. It is harder to take a deep breath if you are having pain. AFTER USE  Rest and breathe slowly and easily.  It can be helpful to keep track of a log of your progress. Your caregiver can provide you with a simple table to help with this. If you are using the spirometer at home, follow these instructions: SEEK MEDICAL CARE IF:   You are having difficultly using the spirometer.  You have trouble using the spirometer as often as instructed.  Your pain medication is not giving enough relief while using the spirometer.  You develop fever of 100.5 F (38.1 C) or higher. SEEK IMMEDIATE MEDICAL CARE IF:   You cough up bloody sputum that had not been present before.  You develop fever of 102 F (38.9 C) or greater.  You develop worsening pain at or near the incision site. MAKE SURE YOU:   Understand these instructions.  Will watch your condition.  Will get help right away if you are not doing well or get worse. Document Released: 12/03/2006 Document Revised: 10/15/2011 Document Reviewed: 02/03/2007 [facility] Patient Information 2014 [place], [place].   ________________________________________________________________________

## 2020-05-27 NOTE — Progress Notes (Addendum)
COVID Vaccine Completed: Yes Date COVID Vaccine completed: 10/22/19 11/24/19 COVID vaccine manufacturer:     Moderna      PCP - N/A Cardiologist - N/A  Chest x-ray - N/A EKG - N/A Stress Test - N/A ECHO - N/A Cardiac Cath - N/A Pacemaker/ICD device last checked:N/A  Sleep Study - N/A CPAP - N/A  Fasting Blood Sugar - N/A Checks Blood Sugar _N/A____ times a day  Blood Thinner Instructions: N/A Aspirin Instructions: N/A Last Dose: N/A  Anesthesia review:  N/A  Patient denies shortness of breath, fever, cough and chest pain at PAT appointment   Patient verbalized understanding of instructions that were given to them at the PAT appointment. Patient was also instructed that they will need to review over the PAT instructions again at home before surgery.

## 2020-05-28 LAB — HEMOGLOBIN A1C
Hgb A1c MFr Bld: 5.1 % (ref 4.8–5.6)
Mean Plasma Glucose: 100 mg/dL

## 2020-05-29 NOTE — H&P (Signed)
Shawn Reese  Location: [facility] Patient #: [id number] DOB: [birth year] Married / Language: English / Race: White Male  History of Present Illness Shawn Reese Standing MD; 05/05/2020 5:48 PM) The patient is a 59 year old male who presents with a complaint of colostomy. The PCP is He comes by himself.  He has done well since I last saw him.  I did a sigmoid colon resection with end colostomy on 12/19/2019 for an obstruction of the sigmoid colon. This obstruction proved benign. He had a colonoscopy before the operation by Dr. Bosie Clos - but he could not get the obstruction.   I reviewed with the patient the findings and need for colon surgery. I discussed both surgical and non surgical options. I discussed the role of laparoscopic and open surgery in colon surgery. I reviewed the risks of surgery, including, but not limited to, infection, bleeding, nerve injury, anastomotic leaks, and possibility of colostomy.  I went over the preoperative mechanical and antibiotic bowel prep for colon surgery and provided prescriptions for these.  I provided the patient literature on colon surgery. I tried to answer all questions for the patient.  Plan: 1. Dr. Bosie Clos to update his colonoscopy, 2. BE before surgery, 3. Reverse his colostomy. To give mechanical and antibiotic bowel prep  Review of Systems as stated in this history (HPI) or in the review of systems. Otherwise all other 12 point ROS are negative  Past Medical History: 1. Benign colonic obstruction - Resection of distal left colon/proximal sigmoid colon WITH END COLOSTOMY - 19 Dec 2019 - Shawn Reese  Social History: Married. Wife - Delice Bison [phone] He works as a Wellsite geologist for [employer]  The patient's family history was non contributory.  Allergies (April Staton, CMA; 05/05/2020 9:21 AM) No Known Drug Allergies  [05/05/2020]:  Medication History (April Staton, CMA; 05/05/2020  9:21 AM) No Current Medications Medications Reconciled  Review of Systems (April Staton CMA; 05/05/2020 9:21 AM) General Not Present- Appetite Loss, Chills, Fatigue, Fever, Night Sweats, Weight Gain and Weight Loss. Skin Not Present- Change in Wart/Mole, Dryness, Hives, Jaundice, New Lesions, Non-Healing Wounds, Rash and Ulcer. HEENT Present- Wears glasses/contact lenses. Not Present- Earache, Hearing Loss, Hoarseness, Nose Bleed, Oral Ulcers, Ringing in the Ears, Seasonal Allergies, Sinus Pain, Sore Throat, Visual Disturbances and Yellow Eyes. Breast Not Present- Breast Mass, Breast Pain, Nipple Discharge and Skin Changes. Cardiovascular Not Present- Chest Pain, Difficulty Breathing Lying Down, Leg Cramps, Palpitations, Rapid Heart Rate, Shortness of Breath and Swelling of Extremities. Gastrointestinal Present- Abdominal Pain. Not Present- Bloating, Bloody Stool, Change in Bowel Habits, Chronic diarrhea, Constipation, Difficulty Swallowing, Excessive gas, Gets full quickly at meals, Hemorrhoids, Indigestion, Nausea, Rectal Pain and Vomiting. Male Genitourinary Not Present- Blood in Urine, Change in Urinary Stream, Frequency, Impotence, Nocturia, Painful Urination, Urgency and Urine Leakage. Musculoskeletal Not Present- Back Pain, Joint Pain, Joint Stiffness, Muscle Pain, Muscle Weakness and Swelling of Extremities. Neurological Not Present- Decreased Memory, Fainting, Headaches, Numbness, Seizures, Tingling, Tremor, Trouble walking and Weakness. Psychiatric Not Present- Anxiety, Bipolar, Change in Sleep Pattern, Depression, Fearful and Frequent crying. Endocrine Not Present- Cold Intolerance, Excessive Hunger, Hair Changes, Heat Intolerance, Hot flashes and New Diabetes. Hematology Not Present- Blood Thinners, Easy Bruising, Excessive bleeding, Gland problems, HIV and Persistent Infections.  Vitals (April Staton CMA; 05/05/2020 9:22 AM) 05/05/2020 9:21 AM Weight: 162.5 lb Height: 67in Body  Surface Area: 1.85 m Body Mass Index: 25.45 kg/m  Temp.: 98.76F (Temporal)  Pulse: 64 (Regular)  P.OX: 98% (Room air) BP:  120/86(Sitting, Left Arm, Standard)   Physical Exam  General: WN WM who is alert and generally healthy appearing. He is wearing a mask. HEENT: Normal. Pupils equal.  Neck: Supple. No mass. No thyroid mass.  Lymph Nodes: No supraclavicular or cervical nodes.  Lungs: Clear to auscultation and symmetric breath sounds. Heart: RRR. No murmur or rub.  Abdomen: Soft. No mass. No tenderness. No hernia. Normal bowel sounds.  Has left sided colostomy. His midline incision is well healed.  Extremities: Good strength and ROM in upper and lower extremities.   Assessment & Plan  1.  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 Dr. Bosie Clos to update his colonoscopy  2. BE before surgery  3. Reverse his colostomy  Ovidio Kin, MD, [facility] Surgery Office phone:  [phone]

## 2020-05-30 ENCOUNTER — Inpatient Hospital Stay (HOSPITAL_COMMUNITY): Admitting: Anesthesiology

## 2020-05-30 ENCOUNTER — Encounter (HOSPITAL_COMMUNITY)
Admission: RE | Disposition: A | Discharge status: Home / Self Care | Payer: Self-pay | Source: Home / Self Care | Attending: Surgery

## 2020-05-30 ENCOUNTER — Other Ambulatory Visit: Payer: Self-pay

## 2020-05-30 ENCOUNTER — Encounter (HOSPITAL_COMMUNITY): Payer: Self-pay | Admitting: Surgery

## 2020-05-30 ENCOUNTER — Inpatient Hospital Stay (HOSPITAL_COMMUNITY)
Admission: RE | Admit: 2020-05-30 | Discharge: 2020-06-02 | DRG: 331 | Disposition: A | Discharge status: Home / Self Care | Attending: Surgery | Admitting: Surgery

## 2020-05-30 DIAGNOSIS — F17210 Nicotine dependence, cigarettes, uncomplicated: Secondary | ICD-10-CM | POA: Diagnosis present

## 2020-05-30 DIAGNOSIS — Z433 Encounter for attention to colostomy: Secondary | ICD-10-CM | POA: Diagnosis present

## 2020-05-30 DIAGNOSIS — Z20822 Contact with and (suspected) exposure to covid-19: Secondary | ICD-10-CM | POA: Diagnosis present

## 2020-05-30 DIAGNOSIS — Z933 Colostomy status: Secondary | ICD-10-CM

## 2020-05-30 DIAGNOSIS — Z86010 Personal history of colonic polyps: Secondary | ICD-10-CM | POA: Diagnosis not present

## 2020-05-30 DIAGNOSIS — K5730 Diverticulosis of large intestine without perforation or abscess without bleeding: Secondary | ICD-10-CM | POA: Diagnosis present

## 2020-05-30 DIAGNOSIS — K660 Peritoneal adhesions (postprocedural) (postinfection): Secondary | ICD-10-CM | POA: Diagnosis present

## 2020-05-30 HISTORY — PX: COLOSTOMY TAKEDOWN: SHX5258

## 2020-05-30 SURGERY — CLOSURE, COLOSTOMY, LAPAROSCOPIC
Anesthesia: General

## 2020-05-30 MED ORDER — FENTANYL CITRATE (PF) 100 MCG/2ML IJ SOLN
25.0000 ug | INTRAMUSCULAR | Status: DC | PRN
  Administered 2020-05-30: 50 ug via INTRAVENOUS

## 2020-05-30 MED ORDER — ROCURONIUM BROMIDE 10 MG/ML (PF) SYRINGE
PREFILLED_SYRINGE | INTRAVENOUS | Status: AC
  Filled 2020-05-30: qty 10

## 2020-05-30 MED ORDER — ONDANSETRON HCL 4 MG/2ML IJ SOLN: 4.0000 mg | Freq: Four times a day (QID) | INTRAMUSCULAR | Status: DC | PRN

## 2020-05-30 MED ORDER — PHENYLEPHRINE HCL-NACL 10-0.9 MG/250ML-% IV SOLN
INTRAVENOUS | Status: DC | PRN
  Administered 2020-05-30: 50 ug/min via INTRAVENOUS

## 2020-05-30 MED ORDER — ORAL CARE MOUTH RINSE: 15.0000 mL | Freq: Once | OROMUCOSAL | Status: AC

## 2020-05-30 MED ORDER — PHENYLEPHRINE HCL (PRESSORS) 10 MG/ML IV SOLN
INTRAVENOUS | Status: AC
  Filled 2020-05-30: qty 1

## 2020-05-30 MED ORDER — ALVIMOPAN 12 MG PO CAPS: 12.0000 mg | ORAL_CAPSULE | ORAL | Status: AC

## 2020-05-30 MED ORDER — SODIUM CHLORIDE 0.9 % IV SOLN
2.0000 g | INTRAVENOUS | Status: AC
  Administered 2020-05-30: 2 g via INTRAVENOUS

## 2020-05-30 MED ORDER — ALVIMOPAN 12 MG PO CAPS
ORAL_CAPSULE | ORAL | Status: AC
  Administered 2020-05-30: 12 mg via ORAL
  Filled 2020-05-30: qty 1

## 2020-05-30 MED ORDER — PHENYLEPHRINE HCL (PRESSORS) 10 MG/ML IV SOLN
INTRAVENOUS | Status: DC | PRN
  Administered 2020-05-30: 120 ug via INTRAVENOUS

## 2020-05-30 MED ORDER — ONDANSETRON HCL 4 MG PO TABS: 4.0000 mg | ORAL_TABLET | Freq: Four times a day (QID) | ORAL | Status: DC | PRN

## 2020-05-30 MED ORDER — SODIUM CHLORIDE 0.9 % IV SOLN
INTRAVENOUS | Status: AC
  Filled 2020-05-30: qty 2

## 2020-05-30 MED ORDER — LACTATED RINGERS IV SOLN: INTRAVENOUS | Status: DC

## 2020-05-30 MED ORDER — BUPIVACAINE HCL 0.25 % IJ SOLN
INTRAMUSCULAR | Status: DC | PRN
  Administered 2020-05-30: 30 mL

## 2020-05-30 MED ORDER — KETOROLAC TROMETHAMINE 30 MG/ML IJ SOLN: 30.0000 mg | Freq: Once | INTRAMUSCULAR | Status: DC | PRN

## 2020-05-30 MED ORDER — FENTANYL CITRATE (PF) 250 MCG/5ML IJ SOLN
INTRAMUSCULAR | Status: DC | PRN
Start: 2020-05-30 — End: 2020-05-30
  Administered 2020-05-30 (×3): 50 ug via INTRAVENOUS
  Administered 2020-05-30: 100 ug via INTRAVENOUS

## 2020-05-30 MED ORDER — CHLORHEXIDINE GLUCONATE 4 % EX LIQD: 60.0000 mL | Freq: Once | CUTANEOUS | Status: DC

## 2020-05-30 MED ORDER — DEXAMETHASONE SODIUM PHOSPHATE 10 MG/ML IJ SOLN
INTRAMUSCULAR | Status: DC | PRN
  Administered 2020-05-30: 5 mg via INTRAVENOUS

## 2020-05-30 MED ORDER — TRAMADOL HCL 50 MG PO TABS
50.0000 mg | ORAL_TABLET | Freq: Four times a day (QID) | ORAL | Status: DC | PRN
  Administered 2020-05-30: 18:00:00 50 mg via ORAL
  Filled 2020-05-30: qty 1

## 2020-05-30 MED ORDER — ROCURONIUM BROMIDE 10 MG/ML (PF) SYRINGE
PREFILLED_SYRINGE | INTRAVENOUS | Status: DC | PRN
  Administered 2020-05-30: 60 mg via INTRAVENOUS
  Administered 2020-05-30: 5 mg via INTRAVENOUS
  Administered 2020-05-30: 20 mg via INTRAVENOUS
  Administered 2020-05-30: 10 mg via INTRAVENOUS
  Administered 2020-05-30: 20 mg via INTRAVENOUS
  Administered 2020-05-30: 10 mg via INTRAVENOUS
  Administered 2020-05-30: 5 mg via INTRAVENOUS
  Administered 2020-05-30 (×2): 10 mg via INTRAVENOUS

## 2020-05-30 MED ORDER — FENTANYL CITRATE (PF) 250 MCG/5ML IJ SOLN
INTRAMUSCULAR | Status: AC
  Filled 2020-05-30: qty 5

## 2020-05-30 MED ORDER — ENSURE PRE-SURGERY PO LIQD
592.0000 mL | Freq: Once | ORAL | Status: DC
  Filled 2020-05-30: qty 592

## 2020-05-30 MED ORDER — OXYCODONE HCL 5 MG PO TABS: 5.0000 mg | ORAL_TABLET | Freq: Once | ORAL | Status: DC | PRN

## 2020-05-30 MED ORDER — LIDOCAINE HCL 2 % IJ SOLN
INTRAMUSCULAR | Status: AC
  Filled 2020-05-30: qty 20

## 2020-05-30 MED ORDER — BUPIVACAINE HCL 0.25 % IJ SOLN
INTRAMUSCULAR | Status: AC
  Filled 2020-05-30: qty 1

## 2020-05-30 MED ORDER — ENSURE PRE-SURGERY PO LIQD
296.0000 mL | Freq: Once | ORAL | Status: DC
  Filled 2020-05-30: qty 296

## 2020-05-30 MED ORDER — DEXAMETHASONE SODIUM PHOSPHATE 10 MG/ML IJ SOLN
INTRAMUSCULAR | Status: AC
  Filled 2020-05-30: qty 1

## 2020-05-30 MED ORDER — BUPIVACAINE-EPINEPHRINE (PF) 0.5% -1:200000 IJ SOLN
INTRAMUSCULAR | Status: AC
  Filled 2020-05-30: qty 30

## 2020-05-30 MED ORDER — MORPHINE SULFATE (PF) 2 MG/ML IV SOLN: 1.0000 mg | INTRAVENOUS | Status: DC | PRN

## 2020-05-30 MED ORDER — LACTATED RINGERS IR SOLN
Status: DC | PRN
  Administered 2020-05-30: 1000 mL

## 2020-05-30 MED ORDER — MIDAZOLAM HCL 5 MG/5ML IJ SOLN
INTRAMUSCULAR | Status: DC | PRN
  Administered 2020-05-30: 2 mg via INTRAVENOUS

## 2020-05-30 MED ORDER — ENOXAPARIN SODIUM 40 MG/0.4ML ~~LOC~~ SOLN
40.0000 mg | SUBCUTANEOUS | Status: DC
  Administered 2020-05-31 – 2020-06-02 (×3): 40 mg via SUBCUTANEOUS
  Filled 2020-05-30 (×3): qty 0.4

## 2020-05-30 MED ORDER — ONDANSETRON HCL 4 MG/2ML IJ SOLN
INTRAMUSCULAR | Status: DC | PRN
  Administered 2020-05-30: 4 mg via INTRAVENOUS

## 2020-05-30 MED ORDER — ONDANSETRON HCL 4 MG/2ML IJ SOLN
INTRAMUSCULAR | Status: AC
  Filled 2020-05-30: qty 2

## 2020-05-30 MED ORDER — HEPARIN SODIUM (PORCINE) 5000 UNIT/ML IJ SOLN: 5000.0000 [IU] | Freq: Once | INTRAMUSCULAR | Status: AC

## 2020-05-30 MED ORDER — BUPIVACAINE LIPOSOME 1.3 % IJ SUSP
INTRAMUSCULAR | Status: DC | PRN
  Administered 2020-05-30: 20 mL

## 2020-05-30 MED ORDER — PROMETHAZINE HCL 25 MG/ML IJ SOLN: 6.2500 mg | INTRAMUSCULAR | Status: DC | PRN

## 2020-05-30 MED ORDER — ENSURE SURGERY PO LIQD
237.0000 mL | Freq: Two times a day (BID) | ORAL | Status: DC
  Administered 2020-06-01 – 2020-06-02 (×3): 237 mL via ORAL

## 2020-05-30 MED ORDER — ACETAMINOPHEN 325 MG PO TABS
650.0000 mg | ORAL_TABLET | Freq: Four times a day (QID) | ORAL | Status: DC
  Administered 2020-05-30 – 2020-06-02 (×12): 650 mg via ORAL
  Filled 2020-05-30 (×12): qty 2

## 2020-05-30 MED ORDER — CHLORHEXIDINE GLUCONATE 0.12 % MT SOLN
15.0000 mL | Freq: Once | OROMUCOSAL | Status: AC
  Administered 2020-05-30: 15 mL via OROMUCOSAL

## 2020-05-30 MED ORDER — SACCHAROMYCES BOULARDII 250 MG PO CAPS
250.0000 mg | ORAL_CAPSULE | Freq: Two times a day (BID) | ORAL | Status: DC
  Administered 2020-05-31 – 2020-06-02 (×5): 250 mg via ORAL
  Filled 2020-05-30 (×5): qty 1

## 2020-05-30 MED ORDER — PROPOFOL 10 MG/ML IV BOLUS
INTRAVENOUS | Status: AC
  Filled 2020-05-30: qty 20

## 2020-05-30 MED ORDER — PROPOFOL 10 MG/ML IV BOLUS
INTRAVENOUS | Status: DC | PRN
  Administered 2020-05-30: 150 mg via INTRAVENOUS

## 2020-05-30 MED ORDER — HEPARIN SODIUM (PORCINE) 5000 UNIT/ML IJ SOLN
INTRAMUSCULAR | Status: AC
  Administered 2020-05-30: 5000 [IU] via SUBCUTANEOUS
  Filled 2020-05-30: qty 1

## 2020-05-30 MED ORDER — LIDOCAINE 2% (20 MG/ML) 5 ML SYRINGE
INTRAMUSCULAR | Status: DC | PRN
  Administered 2020-05-30: 1.5 mg/kg/h via INTRAVENOUS
  Administered 2020-05-30: 60 mg via INTRAVENOUS

## 2020-05-30 MED ORDER — BUPIVACAINE LIPOSOME 1.3 % IJ SUSP
20.0000 mL | Freq: Once | INTRAMUSCULAR | Status: DC
  Filled 2020-05-30: qty 20

## 2020-05-30 MED ORDER — ALVIMOPAN 12 MG PO CAPS
12.0000 mg | ORAL_CAPSULE | Freq: Two times a day (BID) | ORAL | Status: DC
  Administered 2020-05-31 – 2020-06-02 (×5): 12 mg via ORAL
  Filled 2020-05-30 (×5): qty 1

## 2020-05-30 MED ORDER — ACETAMINOPHEN 500 MG PO TABS
1000.0000 mg | ORAL_TABLET | Freq: Once | ORAL | Status: AC
  Administered 2020-05-30: 1000 mg via ORAL
  Filled 2020-05-30: qty 2

## 2020-05-30 MED ORDER — FENTANYL CITRATE (PF) 100 MCG/2ML IJ SOLN
INTRAMUSCULAR | Status: AC
  Filled 2020-05-30: qty 2

## 2020-05-30 MED ORDER — KCL IN DEXTROSE-NACL 20-5-0.45 MEQ/L-%-% IV SOLN
INTRAVENOUS | Status: DC
  Filled 2020-05-30 (×6): qty 1000

## 2020-05-30 MED ORDER — KETAMINE HCL 10 MG/ML IJ SOLN
INTRAMUSCULAR | Status: DC | PRN
  Administered 2020-05-30: 30 mg via INTRAVENOUS

## 2020-05-30 MED ORDER — OXYCODONE HCL 5 MG/5ML PO SOLN: 5.0000 mg | Freq: Once | ORAL | Status: DC | PRN

## 2020-05-30 MED ORDER — MIDAZOLAM HCL 2 MG/2ML IJ SOLN
INTRAMUSCULAR | Status: AC
  Filled 2020-05-30: qty 2

## 2020-05-30 MED ORDER — LIDOCAINE 2% (20 MG/ML) 5 ML SYRINGE
INTRAMUSCULAR | Status: AC
  Filled 2020-05-30: qty 5

## 2020-05-30 MED ORDER — KETAMINE HCL 10 MG/ML IJ SOLN
INTRAMUSCULAR | Status: AC
  Filled 2020-05-30: qty 1

## 2020-05-30 MED ORDER — SUGAMMADEX SODIUM 200 MG/2ML IV SOLN
INTRAVENOUS | Status: DC | PRN
  Administered 2020-05-30: 150 mg via INTRAVENOUS

## 2020-05-30 MED ORDER — SODIUM CHLORIDE 0.9 % IR SOLN
Status: DC | PRN
  Administered 2020-05-30: 1000 mL

## 2020-05-30 SURGICAL SUPPLY — 75 items
APPLIER CLIP 5 13 M/L LIGAMAX5 (MISCELLANEOUS)
APPLIER CLIP ROT 10 11.4 M/L (STAPLE)
BLADE EXTENDED COATED 6.5IN (ELECTRODE) IMPLANT
BLADE HEX COATED 2.75 (ELECTRODE) IMPLANT
CABLE HIGH FREQUENCY MONO STRZ (ELECTRODE) ×3 IMPLANT
CELLS DAT CNTRL 66122 CELL SVR (MISCELLANEOUS) IMPLANT
CLIP APPLIE 5 13 M/L LIGAMAX5 (MISCELLANEOUS) IMPLANT
CLIP APPLIE ROT 10 11.4 M/L (STAPLE) IMPLANT
COVER SURGICAL LIGHT HANDLE (MISCELLANEOUS) ×6 IMPLANT
COVER WAND RF STERILE (DRAPES) ×3 IMPLANT
DECANTER SPIKE VIAL GLASS SM (MISCELLANEOUS) ×3 IMPLANT
DERMABOND ADVANCED (GAUZE/BANDAGES/DRESSINGS) ×2
DERMABOND ADVANCED .7 DNX12 (GAUZE/BANDAGES/DRESSINGS) ×1 IMPLANT
DEVICE TROCAR PUNCTURE CLOSURE (ENDOMECHANICALS) ×3 IMPLANT
DISSECTOR BLUNT TIP ENDO 5MM (MISCELLANEOUS) IMPLANT
DRAIN CHANNEL 19F RND (DRAIN) IMPLANT
DRAPE UTILITY XL STRL (DRAPES) ×3 IMPLANT
DRSG OPSITE POSTOP 4X10 (GAUZE/BANDAGES/DRESSINGS) IMPLANT
DRSG OPSITE POSTOP 4X6 (GAUZE/BANDAGES/DRESSINGS) IMPLANT
DRSG OPSITE POSTOP 4X8 (GAUZE/BANDAGES/DRESSINGS) IMPLANT
DRSG TELFA 3X8 NADH (GAUZE/BANDAGES/DRESSINGS) ×3 IMPLANT
ELECT REM PT RETURN 15FT ADLT (MISCELLANEOUS) ×3 IMPLANT
EVACUATOR SILICONE 100CC (DRAIN) IMPLANT
GAUZE SPONGE 4X4 12PLY STRL (GAUZE/BANDAGES/DRESSINGS) ×3 IMPLANT
GLOVE SURG SYN 7.5  E (GLOVE) ×4
GLOVE SURG SYN 7.5 E (GLOVE) ×2 IMPLANT
GOWN STRL REUS W/TWL XL LVL3 (GOWN DISPOSABLE) ×12 IMPLANT
GRASPER ENDOPATH ANVIL 10MM (MISCELLANEOUS) ×3 IMPLANT
HOLDER FOLEY CATH W/STRAP (MISCELLANEOUS) ×3 IMPLANT
KIT SIGMOIDOSCOPE (SET/KITS/TRAYS/PACK) ×3 IMPLANT
KIT TURNOVER KIT A (KITS) ×3 IMPLANT
NEEDLE HYPO 25X1 1.5 SAFETY (NEEDLE) ×3 IMPLANT
PACK COLON (CUSTOM PROCEDURE TRAY) ×3 IMPLANT
PAD POSITIONING PINK XL (MISCELLANEOUS) ×3 IMPLANT
PENCIL SMOKE EVACUATOR (MISCELLANEOUS) IMPLANT
PORT LAP GEL ALEXIS MED 5-9CM (MISCELLANEOUS) ×3 IMPLANT
PROTECTOR NERVE ULNAR (MISCELLANEOUS) IMPLANT
RELOAD STAPLER GREEN 60MM (STAPLE) ×2 IMPLANT
RTRCTR WOUND ALEXIS 18CM MED (MISCELLANEOUS)
SET IRRIG TUBING LAPAROSCOPIC (IRRIGATION / IRRIGATOR) ×3 IMPLANT
SET TUBE SMOKE EVAC HIGH FLOW (TUBING) ×3 IMPLANT
SHEARS HARMONIC ACE PLUS 36CM (ENDOMECHANICALS) ×3 IMPLANT
SLEEVE ADV FIXATION 5X100MM (TROCAR) ×9 IMPLANT
STAPLER ECHELON LONG 60 440 (INSTRUMENTS) ×3 IMPLANT
STAPLER ECHELON POWER CIR 29 (STAPLE) ×3 IMPLANT
STAPLER RELOAD GREEN 60MM (STAPLE) ×6
STAPLER VISISTAT 35W (STAPLE) IMPLANT
SURGILUBE 2OZ TUBE FLIPTOP (MISCELLANEOUS) IMPLANT
SUT ETHILON 3 0 PS 1 (SUTURE) IMPLANT
SUT MNCRL AB 4-0 PS2 18 (SUTURE) ×3 IMPLANT
SUT NOVA NAB DX-16 0-1 5-0 T12 (SUTURE) IMPLANT
SUT PDS AB 1 CT1 27 (SUTURE) ×6 IMPLANT
SUT PDS AB 1 CTX 36 (SUTURE) IMPLANT
SUT PDS AB 1 TP1 96 (SUTURE) IMPLANT
SUT PROLENE 2 0 KS (SUTURE) IMPLANT
SUT PROLENE 2 0 SH DA (SUTURE) IMPLANT
SUT SILK 2 0 (SUTURE) ×2
SUT SILK 2 0 SH CR/8 (SUTURE) ×3 IMPLANT
SUT SILK 2-0 18XBRD TIE 12 (SUTURE) ×1 IMPLANT
SUT SILK 3 0 (SUTURE) ×2
SUT SILK 3 0 SH CR/8 (SUTURE) ×3 IMPLANT
SUT SILK 3-0 18XBRD TIE 12 (SUTURE) ×1 IMPLANT
SUT VIC AB 3-0 SH 18 (SUTURE) IMPLANT
SUT VICRYL 0 UR6 27IN ABS (SUTURE) ×3 IMPLANT
SYS LAPSCP GELPORT 120MM (MISCELLANEOUS)
SYSTEM LAPSCP GELPORT 120MM (MISCELLANEOUS) IMPLANT
TOWEL OR NON WOVEN STRL DISP B (DISPOSABLE) ×3 IMPLANT
TRAY FOLEY MTR SLVR 16FR STAT (SET/KITS/TRAYS/PACK) ×3 IMPLANT
TROCAR ADV FIXATION 11X100MM (TROCAR) IMPLANT
TROCAR ADV FIXATION 12X100MM (TROCAR) ×3 IMPLANT
TROCAR ADV FIXATION 5X100MM (TROCAR) ×3 IMPLANT
TROCAR BLADELESS OPT 5 100 (ENDOMECHANICALS) ×3 IMPLANT
TROCAR XCEL BLUNT TIP 100MML (ENDOMECHANICALS) ×3 IMPLANT
TUBING CONNECTING 10 (TUBING) ×4 IMPLANT
TUBING CONNECTING 10' (TUBING) ×2

## 2020-05-30 NOTE — Anesthesia Preprocedure Evaluation (Addendum)
Anesthesia Evaluation  Patient identified by MRN, date of birth, ID band Patient awake    Reviewed: Allergy & Precautions, NPO status , Patient's Chart, lab work & pertinent test results  Airway Mallampati: II  TM Distance: >3 FB Neck ROM: Full    Dental  (+) Missing   Pulmonary Current Smoker and Patient abstained from smoking.,    Pulmonary exam normal breath sounds clear to auscultation       Cardiovascular negative cardio ROS Normal cardiovascular exam Rhythm:Regular Rate:Normal     Neuro/Psych negative neurological ROS  negative psych ROS   GI/Hepatic Neg liver ROS, History of colon polyps   Endo/Other  negative endocrine ROS  Renal/GU negative Renal ROS     Musculoskeletal negative musculoskeletal ROS (+)   Abdominal   Peds  Hematology negative hematology ROS (+)   Anesthesia Other Findings COLOSTOMY  Reproductive/Obstetrics                            Anesthesia Physical Anesthesia Plan  ASA: II  Anesthesia Plan: General   Post-op Pain Management:    Induction: Intravenous  PONV Risk Score and Plan: 2 and Ondansetron, Dexamethasone, Midazolam and Treatment may vary due to age or medical condition  Airway Management Planned: Oral ETT  Additional Equipment:   Intra-op Plan:   Post-operative Plan: Extubation in OR  Informed Consent: I have reviewed the patients History and Physical, chart, labs and discussed the procedure including the risks, benefits and alternatives for the proposed anesthesia with the patient or authorized representative who has indicated his/her understanding and acceptance.     Dental advisory given  Plan Discussed with: CRNA  Anesthesia Plan Comments:        Anesthesia Quick Evaluation

## 2020-05-30 NOTE — Anesthesia Postprocedure Evaluation (Signed)
Anesthesia Post Note  Patient: Shawn Reese  Procedure(s) Performed: LAPAROSCOPIC COLOSTOMY REVERSAL LYSIS OF ADHESIONS (N/A )     Patient location during evaluation: PACU Anesthesia Type: General Level of consciousness: awake and alert Pain management: pain level controlled Vital Signs Assessment: post-procedure vital signs reviewed and stable Respiratory status: spontaneous breathing, nonlabored ventilation, respiratory function stable and patient connected to nasal cannula oxygen Cardiovascular status: blood pressure returned to baseline and stable Postop Assessment: no apparent nausea or vomiting Anesthetic complications: no   No complications documented.  Last Vitals:  Vitals:   05/30/20 1351 05/30/20 1452  BP: 133/89 138/90  Pulse: 80 77  Resp: 18 16  Temp: 36.8 C 36.7 C  SpO2: 95% 97%    Last Pain:  Vitals:   05/30/20 1500  TempSrc:   PainSc: 3                  Mario Voong P Amillya Chavira

## 2020-05-30 NOTE — Interval H&P Note (Signed)
History and Physical Interval Note:  05/30/2020 8:42 AM  Shawn Reese  has presented today for surgery, with the diagnosis of COLOSTOMY.  The various methods of treatment have been discussed with the patient and family. Colonoscopy by Dr. Bosie Clos found a couple of polyps.  His wife is here with him today.  After consideration of risks, benefits and other options for treatment, the patient has consented to  Procedure(s): LAPAROSCOPIC COLOSTOMY REVERSAL (N/A) as a surgical intervention.  The patient's history has been reviewed, patient examined, no change in status, stable for surgery.  I have reviewed the patient's chart and labs.  Questions were answered to the patient's satisfaction.     [name]

## 2020-05-30 NOTE — Transfer of Care (Signed)
Immediate Anesthesia Transfer of Care Note  Patient: Shawn Reese  Procedure(s) Performed: LAPAROSCOPIC COLOSTOMY REVERSAL LYSIS OF ADHESIONS (N/A )  Patient Location: PACU  Anesthesia Type:General  Level of Consciousness: awake, alert  and oriented  Airway & Oxygen Therapy: Patient Spontanous Breathing and Patient connected to face mask oxygen  Post-op Assessment: Report given to RN and Post -op Vital signs reviewed and stable  Post vital signs: Reviewed and stable  Last Vitals:  Vitals Value Taken Time  BP 130/98 05/30/20 1246  Temp    Pulse 89 05/30/20 1247  Resp 13 05/30/20 1247  SpO2 100 % 05/30/20 1247  Vitals shown include unvalidated device data.  Last Pain:  Vitals:   05/30/20 0804  TempSrc:   PainSc: 0-No pain         Complications: No complications documented.

## 2020-05-30 NOTE — Anesthesia Procedure Notes (Signed)
Procedure Name: Intubation Date/Time: 05/30/2020 9:11 AM Performed by: Orest Dikes, CRNA Pre-anesthesia Checklist: Patient identified Patient Re-evaluated:Patient Re-evaluated prior to induction Oxygen Delivery Method: Circle system utilized Preoxygenation: Pre-oxygenation with 100% oxygen Induction Type: IV induction Ventilation: Oral airway inserted - appropriate to patient size and Mask ventilation without difficulty Laryngoscope Size: Miller and 2 Grade View: Grade I Tube type: Oral Tube size: 7.5 mm Number of attempts: 1 Airway Equipment and Method: Stylet Placement Confirmation: ETT inserted through vocal cords under direct vision,  positive ETCO2 and breath sounds checked- equal and bilateral Secured at: 23 cm Tube secured with: Tape Dental Injury: Teeth and Oropharynx as per pre-operative assessment  Comments: perfomed by Williemae Natter

## 2020-05-30 NOTE — Op Note (Addendum)
05/30/2020  12:45 PM  PATIENT:  Tresea Mall, 59 y.o., male, MRN: [id number]  PREOP DIAGNOSIS:  COLOSTOMY  POSTOP DIAGNOSIS:   End colostomy, extensive small bowel adhesions  PROCEDURE:   Procedure(s):  LAPAROSCOPIC COLOSTOMY REVERSAL, LYSIS OF ADHESIONS for 90 minutes  SURGEON:   Ovidio Kin, M.D.  Threasa HeadsGena Fray, M.D.  ANESTHESIA:   general  Anesthesiologist: Leonides Grills, MD CRNA: Lorelee Market, CRNA; Orest Dikes, CRNA  General  EBL:  <75 cc  ml  BLOOD ADMINISTERED: none  DRAINS: Telfa wicks in the ostomy wound  LOCAL MEDICATIONS USED:   20 cc of Exparel and 30 cc of 1/4% marcaine  SPECIMEN:   None  COUNTS CORRECT:  YES  INDICATIONS FOR PROCEDURE:  Burnis Halling is a 59 y.o. (DOB: 1960-09-08) white male whose primary care physician is Patient, No Pcp Per and comes for reversal of a colostomy.   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  He has since had a colonoscopy by Dr. Roderic Scarce.  He comes for colostomy reversal.  The indications and risks of the surgery were explained to the patient.  The risks include, but are not limited to, infection, bleeding, and nerve injury.  PROCEDURE: The patient was taken to room #1 at [facility].  He was placed in lithotomy position.  He had a Foley catheter placed.  His abdomen and perineum were prepped with ChloraPrep and Betadine.  His colostomy was sewn shut.  His abdomen was sterilely draped.  A timeout was held the surgical and the surgical checklist run.  I accessed his abdominal cavity through the right upper quadrant with a 5 mm Ethicon Optiview trocar.  I placed 3 additional trochars: a 12 mm trocar in the right lower quadrant, a 5 mm trocar in the lower mid abdomen, and a 5 mm trocar in the left lower quadrant.  Abdominal exploration revealed right and left lobe liver unremarkable.  The  gallbladder was unremarkable.  The stomach, that I could see, was unremarkable.  He had adhesions in the left upper quadrant at his colostomy site.  He had extensive adhesions of small bowel in the pelvis and the left colonic gutter.  I placed a TAP block bilaterally with 20 cc of local anesthetic on each side.  The local was a mixture of 20 cc of Exparel and 30 cc of 1/4% Marcaine.  I spent 90 minutes doing enterolysis of adhesions freeing up the small bowel from the pelvis, the left colonic gutter, and around the left upper quadrant colostomy.  And I mobilized the left colon laterally up to the splenic flexure, but I did not take the splenic flexure down.  I then dissected out the distal colon stump which was marked with 2 Prolene sutures.  The stump appeared to be about 10 cm above the peritoneal reflection.  At this point Dr. Sheliah Hatch went below and passed the 25 and 29 sizers up the rectum to the distal colon.  It appeared that the distal colon stump would reach adequately to the proximal colon.  I then took the colostomy down with sharp and blunt dissection.  I used the pursestring device to create the pursestring and to remove the distal 2 cm of the colon.  I used the 0 Prolene suture as the pursestring.   I oversewed the pursestring with four 3-0 silk sutures.  The anvil of the 29  EEA was placed in the cut colon and the pursestring was tied down.  The proximal colon looked healthy.  I then dropped the colon with the anvil into the abdominal cavity and used the wound proctector/cap to seal the ostomy wound.  Dr. Sheliah Hatch then went below for a second time with a 29 Ethicon EEA stapler passed up the rectum.  He passed it up to the divided distal colon and the anvil was attached.  However in passing and manipulating the anvil there was an anterior tear in serosa of the distal colon.  I was worried about the serosal tear.  Then we took apart the anvil from the EEA instrument.  The EEA was removed  from the rectum.  I then mobilized the distal sigmoid colon about another 3 cm.  I used 2 firings of the green Ethicon Echelon 60 mm stapler to remove this distal segment of colon that removed the serosal tear.  I now had a clean staple line and the serosal tear had been removed.  Dr. Sheliah Hatch past the 62 EEA up to the distal sigmoid stump.  The anvil was attached and the EEA closed.  The EEA was fired and contained to two complete rings.  I then clamped off the colon proximal to the anastomosis and flooded the pelvis with saline. Dr. Sheliah Hatch insufflated air in the distal colon and there was no evidence of leak.  There appeared to be no tension on the anastomosis.  I reinspected the small bowel and irrigated the abdomen with 500 cc of saline.  We then went from dirty to clean set up, redraping the abdominal wound.  I closed the colostomy wound in 2 layers with #1 PDS sutures.  I infiltrated 10 cc of local anesthetic into the colostomy site and the trocar wounds.  The colostomy wound was then closed with skin staples.  I placed 2 Telfa wicks in the wound.  I closed the trocar sites with a 4-0 Monocryl suture and painted them with Dermabond.  I have a surgeon as a first assist to retract, expose, and assist on this difficult operation.  The patient tolerated the procedure well was transferred recovery room in good condition.  Ovidio Kin, MD, [facility] Surgery Office phone:  [phone]

## 2020-05-31 ENCOUNTER — Encounter (HOSPITAL_COMMUNITY): Payer: Self-pay | Admitting: Surgery

## 2020-05-31 LAB — BASIC METABOLIC PANEL
Anion gap: 8 (ref 5–15)
BUN: 7 mg/dL — ABNORMAL LOW (ref 8–23)
CO2: 25 mmol/L (ref 22–32)
Calcium: 8.6 mg/dL — ABNORMAL LOW (ref 8.9–10.3)
Chloride: 104 mmol/L (ref 98–111)
Creatinine, Ser: 0.68 mg/dL (ref 0.61–1.24)
GFR, Estimated: >60 mL/min (ref >60)
Glucose, Bld: 114 mg/dL — ABNORMAL HIGH (ref 70–99)
Potassium: 4.2 mmol/L (ref 3.5–5.1)
Sodium: 137 mmol/L (ref 135–145)

## 2020-05-31 LAB — CBC
HCT: 43.9 % (ref 39.0–52.0)
Hemoglobin: 14.8 g/dL (ref 13.0–17.0)
MCH: 31.1 pg (ref 26.0–34.0)
MCHC: 33.7 g/dL (ref 30.0–36.0)
MCV: 92.2 fL (ref 80.0–100.0)
Platelets: 273 10*3/uL (ref 150–400)
RBC: 4.76 MIL/uL (ref 4.22–5.81)
RDW: 13.2 % (ref 11.5–15.5)
WBC: 12.3 10*3/uL — ABNORMAL HIGH (ref 4.0–10.5)
nRBC: 0 % (ref 0.0–0.2)

## 2020-05-31 MED ORDER — CHLORHEXIDINE GLUCONATE CLOTH 2 % EX PADS: 6.0000 | MEDICATED_PAD | Freq: Every day | CUTANEOUS | Status: DC

## 2020-05-31 NOTE — Progress Notes (Signed)
[facility] Office:  [phone] General Surgery Progress Note   LOS: 1 day  POD -  1 Day Post-Op  Assessment and Plan: 1. LAPAROSCOPIC COLOSTOMY REVERSAL LYSIS OF ADHESIONS - 05/30/2020 - Ezzard Standing  Taking some clear liquids, on Entereg  To encourage to ambulate  2. DVT prophylaxis - lovenox   Active Problems:   Colostomy in place [facility])  Subjective:  Doing well.  Sore, but not too bad.  Has voided since foley was removed.  Objective:   Vitals:   05/31/20 0147 05/31/20 0614  BP: 121/79 123/85  Pulse: 84 65  Resp: 18 18  Temp: 98.4 F (36.9 C) 98.5 F (36.9 C)  SpO2: 94% 94%     Intake/Output from previous day:  10/25 0701 - 10/26 0700 In: 5965.9 [P.O.:1440; I.V.:4425.9; IV Piggyback:100] Out: 2690 [Urine:2640; Blood:50]  Intake/Output this shift:  No intake/output data recorded.   Physical Exam:   General: WN WM who is alert and oriented.    HEENT: Normal. Pupils equal. .   Lungs: Clear.  Incentive spiromety about 800   Abdomen: Soft, rare BS   Wound: Port sites okay, ostomy wound covered   Lab Results:    Recent Labs    05/31/20 0443  WBC 12.3*  HGB 14.8  HCT 43.9  PLT 273    BMET   Recent Labs    05/31/20 0443  NA 137  K 4.2  CL 104  CO2 25  GLUCOSE 114*  BUN 7*  CREATININE 0.68  CALCIUM 8.6*    PT/INR  No results for input(s): LABPROT, INR in the last 72 hours.  ABG  No results for input(s): PHART, HCO3 in the last 72 hours.  Invalid input(s): PCO2, PO2   Studies/Results:  No results found.   Anti-infectives:   Anti-infectives (From admission, onward)   Start     Dose/Rate Route Frequency Ordered Stop   05/30/20 0800  cefoTEtan (CEFOTAN) 2 g in sodium chloride 0.9 % 100 mL IVPB        2 g 200 mL/hr over 30 Minutes Intravenous On call to O.R. 05/30/20 0272 05/30/20 0919   05/30/20 0756  sodium chloride 0.9 % with cefoTEtan (CEFOTAN) ADS Med       Note to Pharmacy: [name]   : cabinet override       05/30/20 0756 05/30/20 0928      Ovidio Kin, MD, [facility] Surgery Office: [phone] 05/31/2020

## 2020-06-01 LAB — CBC WITH DIFFERENTIAL/PLATELET
Abs Immature Granulocytes: 0.03 10*3/uL (ref 0.00–0.07)
Basophils Absolute: 0 10*3/uL (ref 0.0–0.1)
Basophils Relative: 0 %
Eosinophils Absolute: 0.2 10*3/uL (ref 0.0–0.5)
Eosinophils Relative: 2 %
HCT: 45.2 % (ref 39.0–52.0)
Hemoglobin: 14.9 g/dL (ref 13.0–17.0)
Immature Granulocytes: 0 %
Lymphocytes Relative: 20 %
Lymphs Abs: 2 10*3/uL (ref 0.7–4.0)
MCH: 31.1 pg (ref 26.0–34.0)
MCHC: 33 g/dL (ref 30.0–36.0)
MCV: 94.4 fL (ref 80.0–100.0)
Monocytes Absolute: 1.3 10*3/uL — ABNORMAL HIGH (ref 0.1–1.0)
Monocytes Relative: 13 %
Neutro Abs: 6.7 10*3/uL (ref 1.7–7.7)
Neutrophils Relative %: 65 %
Platelets: 230 10*3/uL (ref 150–400)
RBC: 4.79 MIL/uL (ref 4.22–5.81)
RDW: 13.2 % (ref 11.5–15.5)
WBC: 10.2 10*3/uL (ref 4.0–10.5)
nRBC: 0 % (ref 0.0–0.2)

## 2020-06-01 LAB — BASIC METABOLIC PANEL
Anion gap: 7 (ref 5–15)
BUN: 6 mg/dL — ABNORMAL LOW (ref 8–23)
CO2: 25 mmol/L (ref 22–32)
Calcium: 8.8 mg/dL — ABNORMAL LOW (ref 8.9–10.3)
Chloride: 104 mmol/L (ref 98–111)
Creatinine, Ser: 0.72 mg/dL (ref 0.61–1.24)
GFR, Estimated: >60 mL/min (ref >60)
Glucose, Bld: 106 mg/dL — ABNORMAL HIGH (ref 70–99)
Potassium: 4.1 mmol/L (ref 3.5–5.1)
Sodium: 136 mmol/L (ref 135–145)

## 2020-06-01 NOTE — Progress Notes (Addendum)
[facility] Office:  [phone] General Surgery Progress Note   LOS: 2 days  POD -  2 Days Post-Op  Assessment and Plan: 1. LAPAROSCOPIC COLOSTOMY REVERSAL, LYSIS OF ADHESIONS - 05/30/2020 Shawn Reese  On Entereg  To advance to full liquids.  Doing well 2. DVT prophylaxis - lovenox   Active Problems:   Colostomy in place Shawn Reese)  Subjective:  Doing well. Tolerated clear liquids.  No nausea.  Objective:   Vitals:   05/31/20 2148 06/01/20 0607  BP: 119/86 115/74  Pulse: 72 69  Resp: 16 14  Temp: 98.4 F (36.9 C) 98.2 F (36.8 C)  SpO2: 96% 95%     Intake/Output from previous day:  10/26 0701 - 10/27 0700 In: 4063.9 [P.O.:1380; I.V.:2083.9] Out: 2238 [Urine:2238]  Intake/Output this shift:  No intake/output data recorded.   Physical Exam:   General: WN WM who is alert and oriented.    HEENT: Normal. Pupils equal. .   Lungs: Clear.     Abdomen: Soft, has BS   Wound: Port sites okay, ostomy wound covered   Lab Results:    Recent Labs    05/31/20 0443 06/01/20 0431  WBC 12.3* 10.2  HGB 14.8 14.9  HCT 43.9 45.2  PLT 273 230    BMET   Recent Labs    05/31/20 0443 06/01/20 0431  NA 137 136  K 4.2 4.1  CL 104 104  CO2 25 25  GLUCOSE 114* 106*  BUN 7* 6*  CREATININE 0.68 0.72  CALCIUM 8.6* 8.8*    PT/INR  No results for input(s): LABPROT, INR in the last 72 hours.  ABG  No results for input(s): PHART, HCO3 in the last 72 hours.  Invalid input(s): PCO2, PO2   Studies/Results:  No results found.   Anti-infectives:   Anti-infectives (From admission, onward)   Start     Dose/Rate Route Frequency Ordered Stop   05/30/20 0800  cefoTEtan (CEFOTAN) 2 g in sodium chloride 0.9 % 100 mL IVPB        2 g 200 mL/hr over 30 Minutes Intravenous On call to O.R. 05/30/20 3557 05/30/20 0919   05/30/20 0756  sodium chloride 0.9 % with cefoTEtan (CEFOTAN) ADS Med       Note to Pharmacy: [name]   : cabinet override      05/30/20 0756  05/30/20 0928      Shawn Kin, MD, [facility] Surgery Office: [phone] 06/01/2020

## 2020-06-02 MED ORDER — HYDROCODONE-ACETAMINOPHEN 5-325 MG PO TABS: 1.0000 | ORAL_TABLET | Freq: Four times a day (QID) | ORAL | 0 refills | Status: AC | PRN

## 2020-06-02 NOTE — Discharge Summary (Signed)
Physician Discharge Summary  Patient ID:  Shawn Reese  MRN: [id number]  DOB/AGE: 61-Sep-1962 61 y.o.  Admit date: 05/30/2020 Discharge date: 06/02/2020  Discharge Diagnoses:  1.  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 2.  Extensive small bowel adhesions   Active Problems:   Colostomy in place [facility])  Operation: Procedure(s):  LAPAROSCOPIC COLOSTOMY,  REVERSAL LYSIS OF ADHESIONS on 05/30/2020 - D. Ezzard Standing  Discharged Condition: good  Hospital Course: Shawn Reese is an 59 y.o. male whose primary care physician is Patient, No Pcp Per and who was admitted 05/30/2020 with a chief complaint of end colostomy.   He was brought to the operating room on 05/30/2020 and underwent  LAPAROSCOPIC COLOSTOMY,  REVERSAL LYSIS OF ADHESIONS.   He was started on liquids the night of surgery. He has done well.  His diet was advanced to regular food.  He had a BM today. He is ready for discharge.  The discharge instructions were reviewed with the patient.  Consults: None  Significant Diagnostic Studies: Results for orders placed or performed during the hospital encounter of 05/30/20  Basic metabolic panel  Result Value Ref Range   Sodium 137 135 - 145 mmol/L   Potassium 4.2 3.5 - 5.1 mmol/L   Chloride 104 98 - 111 mmol/L   CO2 25 22 - 32 mmol/L   Glucose, Bld 114 (H) 70 - 99 mg/dL   BUN 7 (L) 8 - 23 mg/dL   Creatinine, Ser 4.09 0.61 - 1.24 mg/dL   Calcium 8.6 (L) 8.9 - 10.3 mg/dL   GFR, Estimated >81 >19 mL/min   Anion gap 8 5 - 15  CBC  Result Value Ref Range   WBC 12.3 (H) 4.0 - 10.5 K/uL   RBC 4.76 4.22 - 5.81 MIL/uL   Hemoglobin 14.8 13.0 - 17.0 g/dL   HCT 14.7 39 - 52 %   MCV 92.2 80.0 - 100.0 fL   MCH 31.1 26.0 - 34.0 pg   MCHC 33.7 30.0 - 36.0 g/dL   RDW 82.9 56.2 - 13.0 %   Platelets 273 150 - 400 K/uL   nRBC 0.0 0.0 - 0.2 %  CBC with Differential/Platelet  Result Value Ref Range   WBC 10.2 4.0 - 10.5 K/uL   RBC 4.79 4.22 - 5.81 MIL/uL    Hemoglobin 14.9 13.0 - 17.0 g/dL   HCT 86.5 39 - 52 %   MCV 94.4 80.0 - 100.0 fL   MCH 31.1 26.0 - 34.0 pg   MCHC 33.0 30.0 - 36.0 g/dL   RDW 78.4 69.6 - 29.5 %   Platelets 230 150 - 400 K/uL   nRBC 0.0 0.0 - 0.2 %   Neutrophils Relative % 65 %   Neutro Abs 6.7 1.7 - 7.7 K/uL   Lymphocytes Relative 20 %   Lymphs Abs 2.0 0.7 - 4.0 K/uL   Monocytes Relative 13 %   Monocytes Absolute 1.3 (H) 0.1 - 1.0 K/uL   Eosinophils Relative 2 %   Eosinophils Absolute 0.2 0.0 - 0.5 K/uL   Basophils Relative 0 %   Basophils Absolute 0.0 0.0 - 0.1 K/uL   Immature Granulocytes 0 %   Abs Immature Granulocytes 0.03 0.00 - 0.07 K/uL  Basic metabolic panel  Result Value Ref Range   Sodium 136 135 - 145 mmol/L   Potassium 4.1 3.5 - 5.1 mmol/L   Chloride 104 98 - 111 mmol/L   CO2 25 22 - 32 mmol/L   Glucose, Bld 106 (H) 70 -  99 mg/dL   BUN 6 (L) 8 - 23 mg/dL   Creatinine, Ser 2.42 0.61 - 1.24 mg/dL   Calcium 8.8 (L) 8.9 - 10.3 mg/dL   GFR, Estimated >68 >34 mL/min   Anion gap 7 5 - 15    DG BE (COLON) THRU COLOSTOMY W SINGLE CM (SOL OR THIN BA)  Result Date: 05/12/2020 CLINICAL DATA:  Status post sigmoid resection with end colostomy 12/19/2019 for diverticular stricture. EXAM: WATER SOLUBLE CONTRAST ENEMA TECHNIQUE: Water-soluble contrast administered retrograde via rectal catheter initially, followed by retrograde water-soluble contrast administration by Foley catheter through end colostomy. FLUOROSCOPY TIME:  Fluoroscopy Time:  3 minutes 18 seconds Radiation Exposure Index (if provided by the fluoroscopic device): 619 mGy Number of Acquired Spot Images: 8 COMPARISON:  12/18/2019 CT abdomen/pelvis. FINDINGS: Scout radiograph demonstrates no dilated small bowel loops. No evidence of pneumatosis or pneumoperitoneum. No radiopaque nephrolithiasis. Hartmann's pouch appears normal with no evidence of contrast leak at the suture line. No filling defects or strictures in the Hartmann's pouch. Mild-to-moderate  diverticulosis is present throughout the remnant colon, left greater than right. Water-soluble contrast traversed retrograde to the level of cecum without delay. Water-soluble contrast fills the normal caliber appendix. No filling defects, strictures or extraluminal contrast leak in the remnant large-bowel. IMPRESSION: 1. Normal Hartmann's pouch with no contrast leak. 2. Mild to moderate diverticulosis throughout the remnant colon, left greater than right. Otherwise normal remnant large bowel with no strictures. Electronically Signed   By: Delbert Phenix M.D.   On: 05/12/2020 08:54    Discharge Exam:  Vitals:   06/02/20 0538 06/02/20 1340  BP: (!) 119/92 121/80  Pulse: 85 81  Resp: 18   Temp: 98 F (36.7 C) 98.2 F (36.8 C)  SpO2: 97% 98%    General: WN WM who is alert and generally healthy appearing.  Lungs: Clear to auscultation and symmetric breath sounds. Heart:  RRR. No murmur or rub. Abdomen: Soft. Normal bowel sounds. The wicks were removed from ostomy site.  Discharge Medications:   Allergies as of 06/02/2020   No Known Allergies     Medication List    TAKE these medications   acetaminophen 325 MG tablet Commonly known as: TYLENOL Take 2 tablets (650 mg total) by mouth every 6 (six) hours.   HYDROcodone-acetaminophen 5-325 MG tablet Commonly known as: NORCO/VICODIN Take 1 tablet by mouth every 6 (six) hours as needed for moderate pain.       Disposition: Discharge disposition: 01-Home or Self Care       Discharge Instructions    Diet - low sodium heart healthy   Complete by: As directed    Increase activity slowly   Complete by: As directed          Signed: Ovidio Kin, M.D., [facility] Office:  [phone]  06/02/2020, 3:31 PM

## 2020-06-02 NOTE — Progress Notes (Signed)
Patient was given discharge instructions, and all questions were answered.  Patient was walked to main exit. 

## 2020-06-02 NOTE — Progress Notes (Signed)
[facility] Office:  [phone] General Surgery Progress Note   LOS: 3 days  POD -  3 Days Post-Op  Assessment and Plan: 1. LAPAROSCOPIC COLOSTOMY REVERSAL, LYSIS OF ADHESIONS - 05/30/2020 Shawn Reese  On Entereg  To advance to reg diet.  If he has a BM, will let go later today. 2. DVT prophylaxis - lovenox   Active Problems:   Colostomy in place [facility])  Subjective:  Doing well. Tolerated full liquids.  No nausea.  Objective:   Vitals:   06/01/20 2049 06/02/20 0538  BP: 132/85 (!) 119/92  Pulse: 71 85  Resp: 18 18  Temp: 98.7 F (37.1 C) 98 F (36.7 C)  SpO2: 95% 97%     Intake/Output from previous day:  10/27 0701 - 10/28 0700 In: 3327.8 [P.O.:2040; I.V.:1287.8] Out: 1700 [Urine:1700]  Intake/Output this shift:  No intake/output data recorded.   Physical Exam:   General: WN WM who is alert and oriented.    HEENT: Normal. Pupils equal. .   Lungs: Clear.     Abdomen: Soft, has BS   Wound: I removed wicks from ostomy wound.  This looks good.   Lab Results:    Recent Labs    05/31/20 0443 06/01/20 0431  WBC 12.3* 10.2  HGB 14.8 14.9  HCT 43.9 45.2  PLT 273 230    BMET   Recent Labs    05/31/20 0443 06/01/20 0431  NA 137 136  K 4.2 4.1  CL 104 104  CO2 25 25  GLUCOSE 114* 106*  BUN 7* 6*  CREATININE 0.68 0.72  CALCIUM 8.6* 8.8*    PT/INR  No results for input(s): LABPROT, INR in the last 72 hours.  ABG  No results for input(s): PHART, HCO3 in the last 72 hours.  Invalid input(s): PCO2, PO2   Studies/Results:  No results found.   Anti-infectives:   Anti-infectives (From admission, onward)   Start     Dose/Rate Route Frequency Ordered Stop   05/30/20 0800  cefoTEtan (CEFOTAN) 2 g in sodium chloride 0.9 % 100 mL IVPB        2 g 200 mL/hr over 30 Minutes Intravenous On call to O.R. 05/30/20 6384 05/30/20 0919   05/30/20 0756  sodium chloride 0.9 % with cefoTEtan (CEFOTAN) ADS Med       Note to Pharmacy: [name]   :  cabinet override      05/30/20 0756 05/30/20 0928      Ovidio Kin, MD, [facility] Surgery Office: [phone] 06/02/2020

## 2020-06-02 NOTE — Discharge Instructions (Signed)
[facility] - DISCHARGE INSTRUCTIONS TO PATIENT  Return to work on:  06/08/2020  Activity:  Driving - May drive in 2 to 4 days, if doing well and off pain meds   Lifting - No lifting more than 15 pounds for 3 weeks                       Practice your Covid-19 protection:  Wear a mask, social distance, and wash your hands frequently  Wound Care:  You may shower when you get home.  Diet:  As tolerated  Follow up appointment:  Call Dr. Allene Pyo office [facility]) at [phone] for an appointment in 2 to 3 weeks.  Medications and dosages:  Resume your home medications.  You have a prescription for:  Vicodin             You may also take Tylenol, ibuprofen, or Aleve for pain  Call Dr. Ezzard Standing or his office  [phone]) if you have:  Temperature greater than 100.4,  Persistent nausea and vomiting,  Severe uncontrolled pain,  Redness, tenderness, or signs of infection (pain, swelling, redness, odor or green/yellow discharge around the site),  Difficulty breathing, headache or visual disturbances,  Any other questions or concerns you may have after discharge.  In an emergency, call 911 or go to an Emergency Department at a nearby hospital.

## 2022-02-19 IMAGING — RF DG BE THRU COLOSTOMY
10 of 11 series · 14 of 24 positions shown · non-contrast
Comparison: 12/18/2019 CT abdomen/pelvis.

CLINICAL DATA: Status post sigmoid resection with end colostomy
12/19/2019 for diverticular stricture.

EXAM:
WATER SOLUBLE CONTRAST ENEMA
TECHNIQUE: Water-soluble contrast administered retrograde via rectal catheter
initially, followed by retrograde water-soluble contrast
administration by Foley catheter through end colostomy.
FLUOROSCOPY TIME:  Fluoroscopy Time:  3 minutes 18 seconds
Radiation Exposure Index (if provided by the fluoroscopic device):
619 mGy
Number of Acquired Spot Images: 8

[Series 1: one shot · 0.14mm/px · 1 of 2 slices shown (1 of 5)]
[im 1/2]
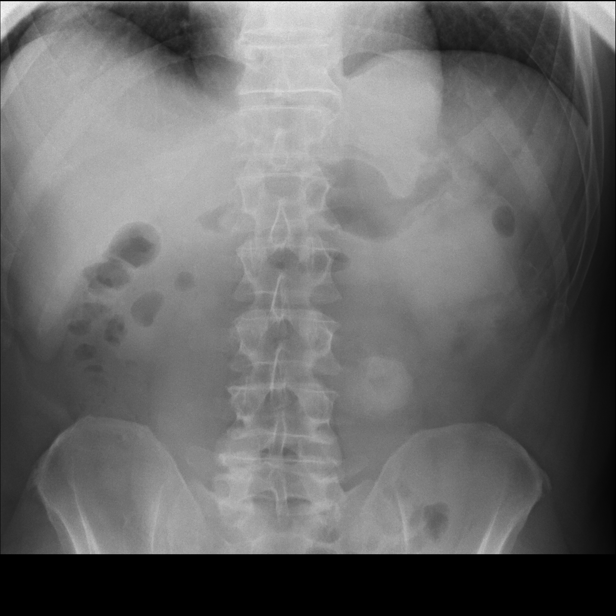

[Series 2: sequence · 2 of 36 frames shown (1 of 5)]
[frame 7/36]
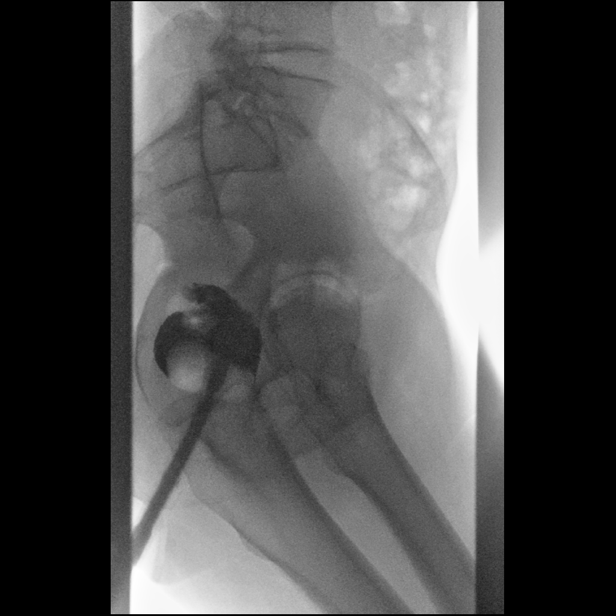
[frame 31/36]
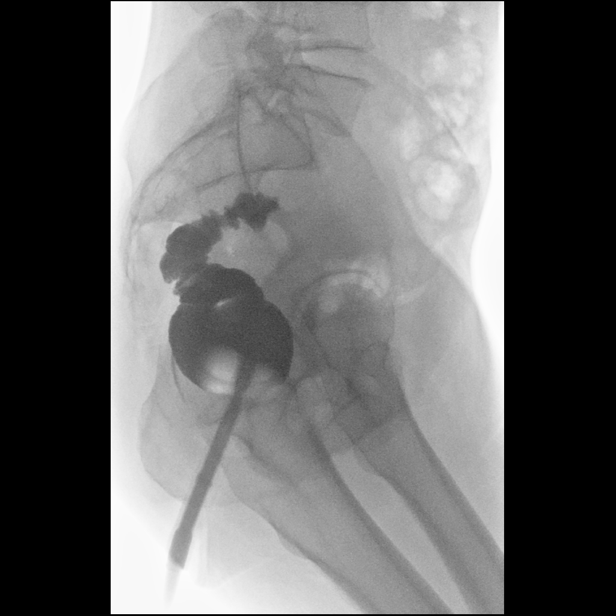

[Series 3: one shot · 0.15mm/px · 1 of 3 slices shown (2 of 5)]
[im 3/3]
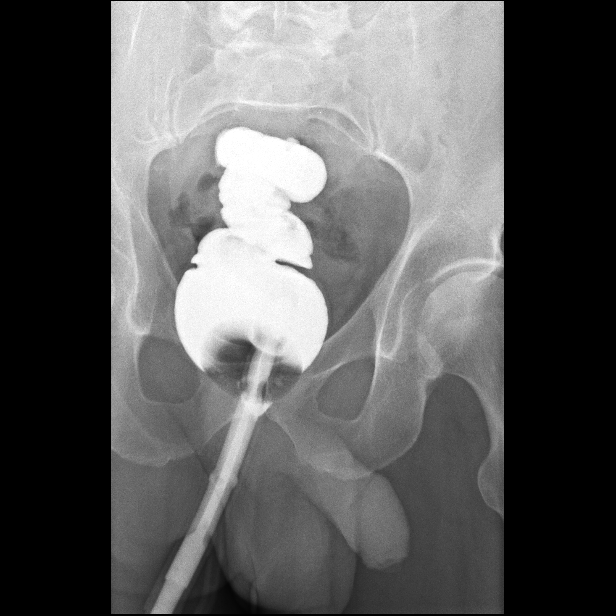

[Series 4: sequence · 2 of 21 frames shown (2 of 5)]
[frame 1/21]
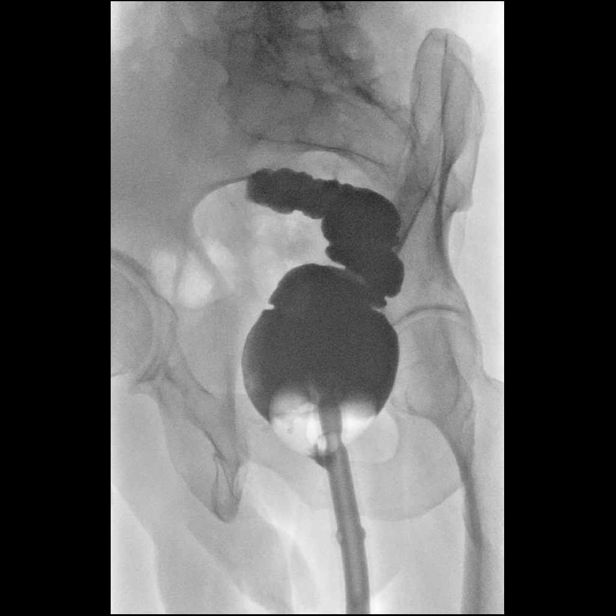
[frame 18/21]
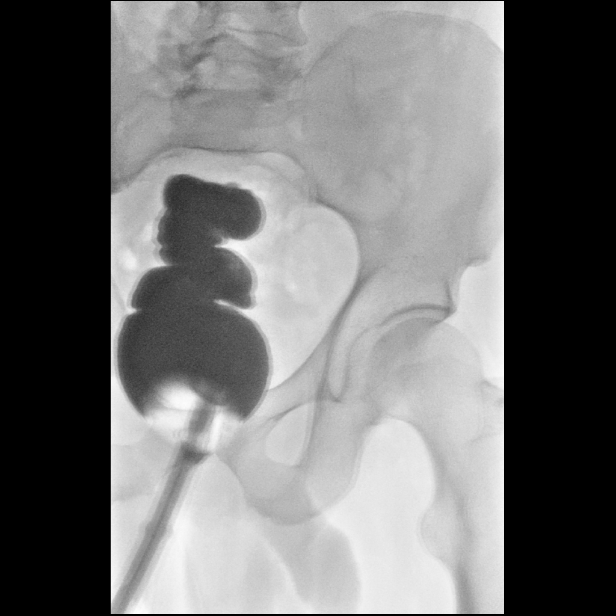

[Series 6: sequence · 2 of 27 frames shown (3 of 5)]
[frame 1/27]
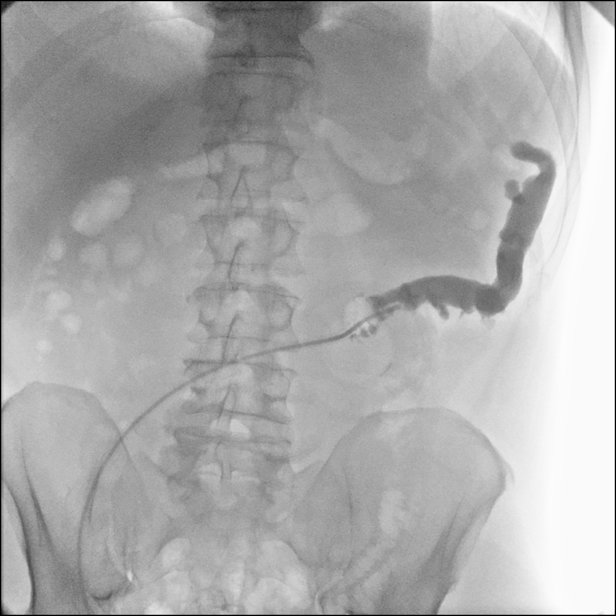
[frame 5/27]
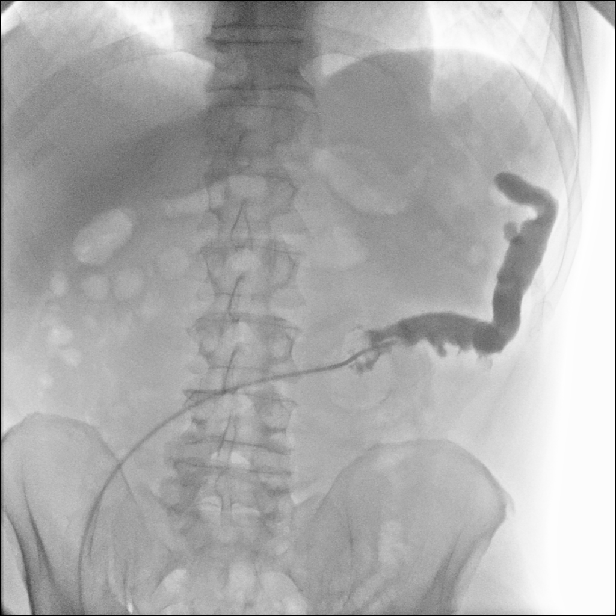

[Series 7: one shot · 0.15mm/px · 1 of 1 slices shown (3 of 5)]
[im 1/1]
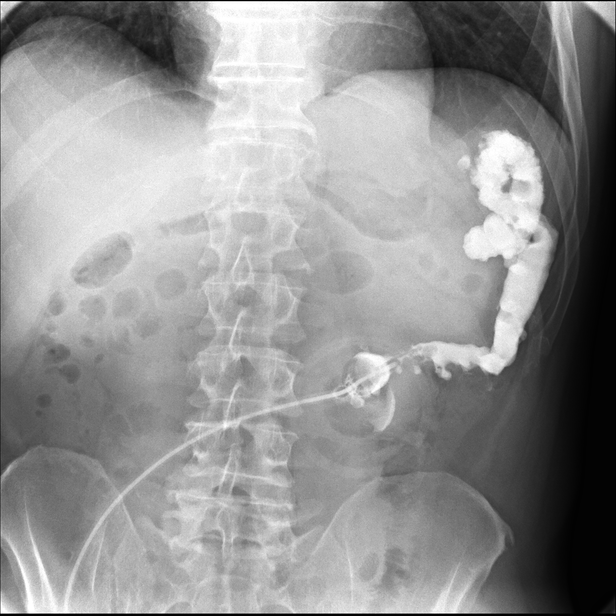

[Series 8: sequence · 1 of 50 frames shown (4 of 5)]
[frame 43/50]
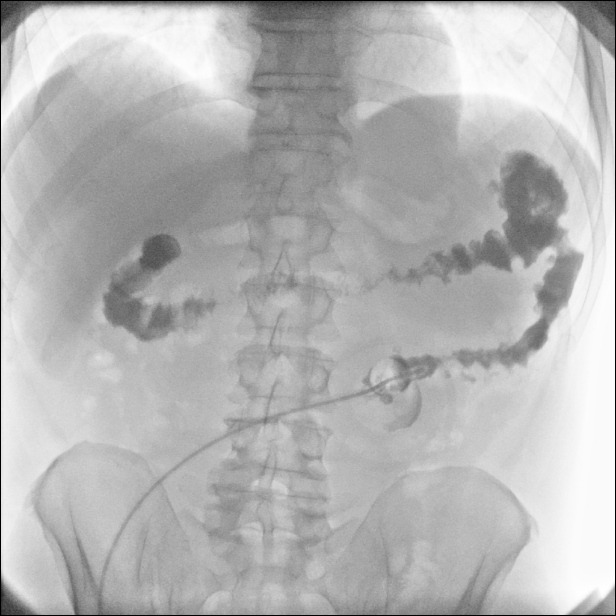

[Series 9: one shot · 0.15mm/px · 2 of 3 slices shown (4 of 5)]
[im 1/3]
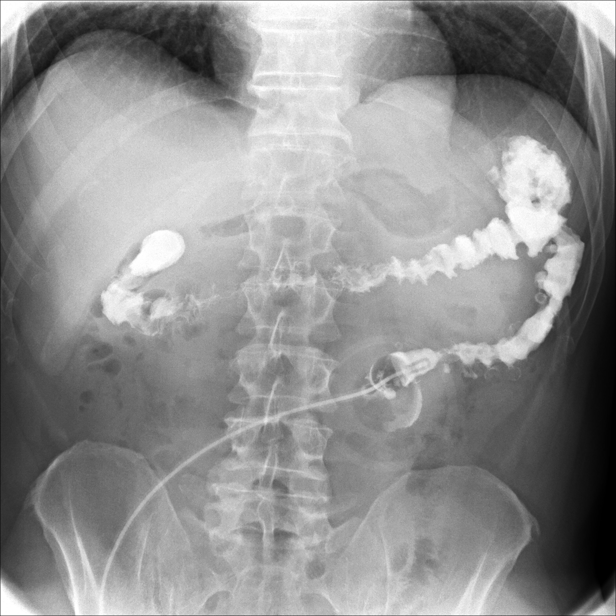
[im 3/3]
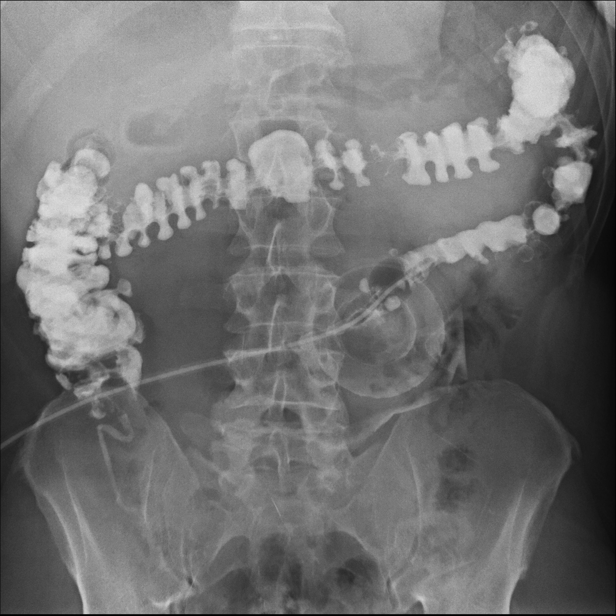

[Series 10: sequence · 1 of 26 frames shown (5 of 5)]
[frame 4/26]
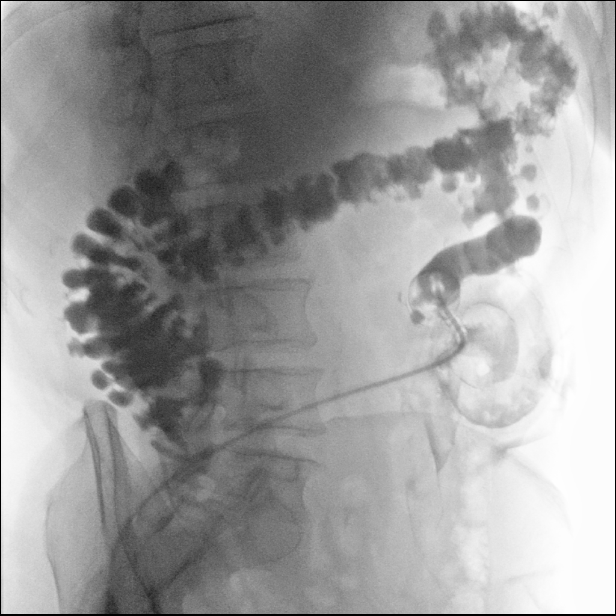

[Series 11: one shot · 0.15mm/px · 1 of 1 slices shown (5 of 5)]
[im 1/1]
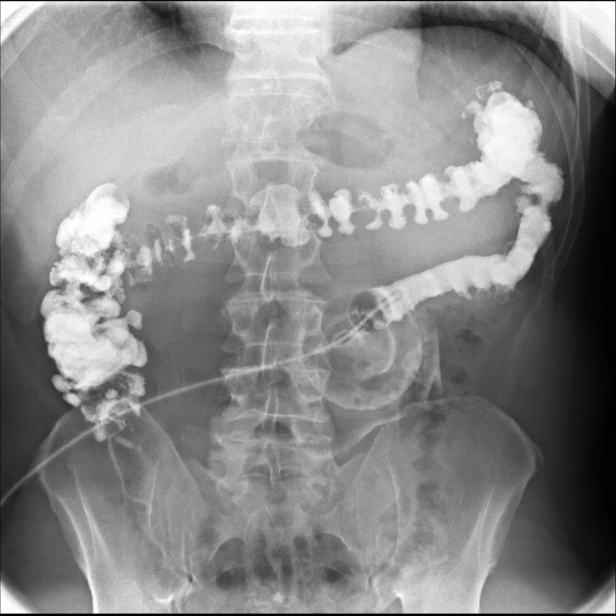

[14 of 24 positions shown; findings below may reference images not displayed]

FINDINGS: Scout radiograph demonstrates no dilated small bowel loops. No
evidence of pneumatosis or pneumoperitoneum. No radiopaque
nephrolithiasis.

Hartmann's pouch appears normal with no evidence of contrast leak at
the suture line. No filling defects or strictures in the Hartmann's
pouch.

Mild-to-moderate diverticulosis is present throughout the remnant
colon, left greater than right. Water-soluble contrast traversed
retrograde to the level of cecum without delay. Water-soluble
contrast fills the normal caliber appendix. No filling defects,
strictures or extraluminal contrast leak in the remnant large-bowel.
IMPRESSION: 1. Normal Hartmann's pouch with no contrast leak.
2. Mild to moderate diverticulosis throughout the remnant colon,
left greater than right. Otherwise normal remnant large bowel with
no strictures.

## 2022-03-18 ENCOUNTER — Emergency Department (HOSPITAL_BASED_OUTPATIENT_CLINIC_OR_DEPARTMENT_OTHER)
Admission: EM | Admit: 2022-03-18 | Discharge: 2022-03-18 | Disposition: A | Discharge status: Home / Self Care | Attending: Emergency Medicine | Admitting: Emergency Medicine

## 2022-03-18 ENCOUNTER — Other Ambulatory Visit: Payer: Self-pay

## 2022-03-18 ENCOUNTER — Encounter (HOSPITAL_BASED_OUTPATIENT_CLINIC_OR_DEPARTMENT_OTHER): Payer: Self-pay | Admitting: Emergency Medicine

## 2022-03-18 DIAGNOSIS — R197 Diarrhea, unspecified: Secondary | ICD-10-CM | POA: Diagnosis present

## 2022-03-18 DIAGNOSIS — A09 Infectious gastroenteritis and colitis, unspecified: Secondary | ICD-10-CM | POA: Diagnosis not present

## 2022-03-18 MED ORDER — SULFAMETHOXAZOLE-TRIMETHOPRIM 800-160 MG PO TABS: 1.0000 | ORAL_TABLET | Freq: Two times a day (BID) | ORAL | 0 refills | Status: AC

## 2022-03-18 NOTE — ED Triage Notes (Addendum)
States he started having diarrhea on Monday night while in the [place]. Flew back here on Tuesday and continues to have loose stool, 3x a day . No abd pain, no vomiting

## 2022-03-18 NOTE — Discharge Instructions (Addendum)
Prescribed an antibiotic that you need to take for the next 1 week.  However in the meantime if you suddenly develop significant pain in your stomach, fever or bloody diarrhea you should return to the emergency room immediately.

## 2022-03-18 NOTE — ED Provider Notes (Signed)
[facility] EMERGENCY DEPARTMENT Provider Note   CSN: [id number] Arrival date & time: 03/18/22  4098     History  Chief Complaint  Patient presents with   Diarrhea    Shawn Reese is a 61 y.o. male.  Patient is a 61 year old male with a medical history significant for ischemic colitis status post ostomy and then ostomy takedown who is presenting today with complaints of diarrhea.  Patient reports vacationing in the [place] last week and since returning from his vacation on Monday he has had at least 3 episodes of diarrhea per day.  He has not had any bloody diarrhea, fever, nausea or vomiting.  He denies any abdominal pain.  He has been eating and drinking but reports he has probably been eating and drinking less because it makes him have diarrhea.  He has no issues with urinating.  While he was vacationing he [place] on a resort and did go out on excursions.  He was told they were being served bottled water but it was being poured from a pitcher.  The history is provided by the patient and medical records.  Diarrhea      Home Medications Prior to Admission medications   Medication Sig Start Date End Date Taking? Authorizing Provider  sulfamethoxazole-trimethoprim (BACTRIM DS) 800-160 MG tablet Take 1 tablet by mouth 2 (two) times daily for 7 days. 03/18/22 03/25/22 Yes Gwyneth Sprout, MD  acetaminophen (TYLENOL) 325 MG tablet Take 2 tablets (650 mg total) by mouth every 6 (six) hours. 12/24/19   Arrien, York Ram, MD  HYDROcodone-acetaminophen (NORCO/VICODIN) 5-325 MG tablet Take 1 tablet by mouth every 6 (six) hours as needed for moderate pain. 06/02/20   Ovidio Kin, MD      Allergies    Patient has no known allergies.    Review of Systems   Review of Systems  Gastrointestinal:  Positive for diarrhea.    Physical Exam Updated Vital Signs BP (!) 143/92 (BP Location: Right Arm)   Pulse 74   Temp 98.5 F (36.9 C) (Oral)   Resp 16   Ht 5'  7" (1.702 m)   Wt 76.2 kg   SpO2 96%   BMI 26.31 kg/m  Physical Exam Vitals and nursing note reviewed.  Constitutional:      General: He is not in acute distress.    Appearance: He is well-developed.  HENT:     Head: Normocephalic and atraumatic.  Eyes:     Conjunctiva/sclera: Conjunctivae normal.     Pupils: Pupils are equal, round, and reactive to light.  Cardiovascular:     Rate and Rhythm: Normal rate and regular rhythm.     Heart sounds: No murmur heard. Pulmonary:     Effort: Pulmonary effort is normal. No respiratory distress.     Breath sounds: Normal breath sounds. No wheezing or rales.  Abdominal:     General: There is no distension.     Palpations: Abdomen is soft.     Tenderness: There is no abdominal tenderness. There is no guarding or rebound.  Musculoskeletal:        General: No tenderness. Normal range of motion.     Cervical back: Normal range of motion and neck supple.  Skin:    General: Skin is warm and dry.     Findings: No erythema or rash.  Neurological:     Mental Status: He is alert and oriented to person, place, and time.  Psychiatric:        Behavior:  Behavior normal.     ED Results / Procedures / Treatments   Labs (all labs ordered are listed, but only abnormal results are displayed) Labs Reviewed - No data to display  EKG None  Radiology No results found.  Procedures Procedures    Medications Ordered in ED Medications - No data to display  ED Course/ Medical Decision Making/ A&P                           Medical Decision Making Risk Prescription drug management.   Patient is presenting today with diarrhea most consistent with traveler's diarrhea.  3-4 episodes per day but no abdominal pain, vomiting or bloody stools.  Patient does have significant history of having a colonic obstruction and ischemic colitis several years ago after a trip to the [place] however patient is not displaying symptoms significant for that  today.  There is no abdominal tenderness he is having no nausea vomiting and is very well appearing.  At this time we will treat for traveler's diarrhea with Bactrim per [facility] recommendations.  Patient was given strict return precautions.        Final Clinical Impression(s) / ED Diagnoses Final diagnoses:  Traveler's diarrhea    Rx / DC Orders ED Discharge Orders          Ordered    sulfamethoxazole-trimethoprim (BACTRIM DS) 800-160 MG tablet  2 times daily        03/18/22 3154              Gwyneth Sprout, MD 03/18/22 [phone]

## 2022-03-18 NOTE — ED Notes (Signed)
ED Provider at bedside.
